# Patient Record
Sex: Female | Born: 1940 | Race: Black or African American | Hispanic: No
Health system: Southern US, Community
[De-identification: ages and names within clinical notes are randomized; demographics above are authoritative.]

## PROBLEM LIST (undated history)

## (undated) DIAGNOSIS — E119 Type 2 diabetes mellitus without complications: Secondary | ICD-10-CM

## (undated) DIAGNOSIS — I441 Atrioventricular block, second degree: Secondary | ICD-10-CM

## (undated) DIAGNOSIS — D509 Iron deficiency anemia, unspecified: Secondary | ICD-10-CM

## (undated) DIAGNOSIS — I509 Heart failure, unspecified: Secondary | ICD-10-CM

## (undated) DIAGNOSIS — L89899 Pressure ulcer of other site, unspecified stage: Secondary | ICD-10-CM

## (undated) DIAGNOSIS — I2510 Atherosclerotic heart disease of native coronary artery without angina pectoris: Secondary | ICD-10-CM

## (undated) DIAGNOSIS — I739 Peripheral vascular disease, unspecified: Secondary | ICD-10-CM

## (undated) HISTORY — PX: OTHER SURGICAL HISTORY: SHX169

---

## 2019-06-15 DIAGNOSIS — U071 COVID-19: Secondary | ICD-10-CM

## 2019-06-15 HISTORY — DX: COVID-19: U07.1

## 2019-07-12 ENCOUNTER — Other Ambulatory Visit: Payer: Self-pay

## 2019-07-12 ENCOUNTER — Inpatient Hospital Stay (HOSPITAL_COMMUNITY)
Admission: EM | Admit: 2019-07-12 | Discharge: 2019-08-23 | DRG: 296 | Disposition: E | Payer: Medicare HMO | Source: Skilled Nursing Facility | Attending: Emergency Medicine | Admitting: Emergency Medicine

## 2019-07-12 ENCOUNTER — Inpatient Hospital Stay (HOSPITAL_COMMUNITY): Payer: Medicare HMO

## 2019-07-12 ENCOUNTER — Encounter (HOSPITAL_COMMUNITY): Payer: Self-pay

## 2019-07-12 ENCOUNTER — Emergency Department (HOSPITAL_COMMUNITY): Payer: Medicare HMO

## 2019-07-12 DIAGNOSIS — E43 Unspecified severe protein-calorie malnutrition: Secondary | ICD-10-CM | POA: Diagnosis present

## 2019-07-12 DIAGNOSIS — Z8616 Personal history of COVID-19: Secondary | ICD-10-CM | POA: Diagnosis not present

## 2019-07-12 DIAGNOSIS — G9341 Metabolic encephalopathy: Secondary | ICD-10-CM | POA: Diagnosis present

## 2019-07-12 DIAGNOSIS — I441 Atrioventricular block, second degree: Secondary | ICD-10-CM | POA: Diagnosis present

## 2019-07-12 DIAGNOSIS — K922 Gastrointestinal hemorrhage, unspecified: Secondary | ICD-10-CM | POA: Diagnosis present

## 2019-07-12 DIAGNOSIS — E11622 Type 2 diabetes mellitus with other skin ulcer: Secondary | ICD-10-CM | POA: Diagnosis present

## 2019-07-12 DIAGNOSIS — N179 Acute kidney failure, unspecified: Secondary | ICD-10-CM | POA: Diagnosis present

## 2019-07-12 DIAGNOSIS — E1169 Type 2 diabetes mellitus with other specified complication: Secondary | ICD-10-CM | POA: Diagnosis present

## 2019-07-12 DIAGNOSIS — E1152 Type 2 diabetes mellitus with diabetic peripheral angiopathy with gangrene: Secondary | ICD-10-CM | POA: Diagnosis present

## 2019-07-12 DIAGNOSIS — J9620 Acute and chronic respiratory failure, unspecified whether with hypoxia or hypercapnia: Secondary | ICD-10-CM | POA: Diagnosis not present

## 2019-07-12 DIAGNOSIS — Z515 Encounter for palliative care: Secondary | ICD-10-CM | POA: Diagnosis not present

## 2019-07-12 DIAGNOSIS — J9602 Acute respiratory failure with hypercapnia: Secondary | ICD-10-CM | POA: Diagnosis not present

## 2019-07-12 DIAGNOSIS — A419 Sepsis, unspecified organism: Secondary | ICD-10-CM | POA: Diagnosis present

## 2019-07-12 DIAGNOSIS — I6523 Occlusion and stenosis of bilateral carotid arteries: Secondary | ICD-10-CM | POA: Diagnosis present

## 2019-07-12 DIAGNOSIS — I110 Hypertensive heart disease with heart failure: Secondary | ICD-10-CM | POA: Diagnosis present

## 2019-07-12 DIAGNOSIS — R001 Bradycardia, unspecified: Secondary | ICD-10-CM | POA: Diagnosis present

## 2019-07-12 DIAGNOSIS — I5032 Chronic diastolic (congestive) heart failure: Secondary | ICD-10-CM | POA: Diagnosis present

## 2019-07-12 DIAGNOSIS — Y839 Surgical procedure, unspecified as the cause of abnormal reaction of the patient, or of later complication, without mention of misadventure at the time of the procedure: Secondary | ICD-10-CM | POA: Diagnosis present

## 2019-07-12 DIAGNOSIS — U071 COVID-19: Secondary | ICD-10-CM | POA: Diagnosis present

## 2019-07-12 DIAGNOSIS — R6521 Severe sepsis with septic shock: Secondary | ICD-10-CM | POA: Diagnosis present

## 2019-07-12 DIAGNOSIS — J9690 Respiratory failure, unspecified, unspecified whether with hypoxia or hypercapnia: Secondary | ICD-10-CM

## 2019-07-12 DIAGNOSIS — I739 Peripheral vascular disease, unspecified: Secondary | ICD-10-CM | POA: Diagnosis present

## 2019-07-12 DIAGNOSIS — Z6823 Body mass index (BMI) 23.0-23.9, adult: Secondary | ICD-10-CM

## 2019-07-12 DIAGNOSIS — Z66 Do not resuscitate: Secondary | ICD-10-CM | POA: Diagnosis not present

## 2019-07-12 DIAGNOSIS — E875 Hyperkalemia: Secondary | ICD-10-CM | POA: Diagnosis present

## 2019-07-12 DIAGNOSIS — L89152 Pressure ulcer of sacral region, stage 2: Secondary | ICD-10-CM | POA: Diagnosis present

## 2019-07-12 DIAGNOSIS — G931 Anoxic brain damage, not elsewhere classified: Secondary | ICD-10-CM | POA: Diagnosis present

## 2019-07-12 DIAGNOSIS — Z9911 Dependence on respirator [ventilator] status: Secondary | ICD-10-CM | POA: Diagnosis not present

## 2019-07-12 DIAGNOSIS — Z9289 Personal history of other medical treatment: Secondary | ICD-10-CM

## 2019-07-12 DIAGNOSIS — E870 Hyperosmolality and hypernatremia: Secondary | ICD-10-CM | POA: Diagnosis not present

## 2019-07-12 DIAGNOSIS — M86172 Other acute osteomyelitis, left ankle and foot: Secondary | ICD-10-CM | POA: Diagnosis present

## 2019-07-12 DIAGNOSIS — I469 Cardiac arrest, cause unspecified: Secondary | ICD-10-CM | POA: Diagnosis present

## 2019-07-12 DIAGNOSIS — R404 Transient alteration of awareness: Secondary | ICD-10-CM | POA: Diagnosis present

## 2019-07-12 DIAGNOSIS — D509 Iron deficiency anemia, unspecified: Secondary | ICD-10-CM | POA: Diagnosis present

## 2019-07-12 DIAGNOSIS — E119 Type 2 diabetes mellitus without complications: Secondary | ICD-10-CM

## 2019-07-12 DIAGNOSIS — L89312 Pressure ulcer of right buttock, stage 2: Secondary | ICD-10-CM | POA: Diagnosis present

## 2019-07-12 DIAGNOSIS — J9621 Acute and chronic respiratory failure with hypoxia: Secondary | ICD-10-CM | POA: Diagnosis present

## 2019-07-12 DIAGNOSIS — L89322 Pressure ulcer of left buttock, stage 2: Secondary | ICD-10-CM | POA: Diagnosis present

## 2019-07-12 DIAGNOSIS — E11649 Type 2 diabetes mellitus with hypoglycemia without coma: Secondary | ICD-10-CM | POA: Diagnosis not present

## 2019-07-12 DIAGNOSIS — S98921A Partial traumatic amputation of right foot, level unspecified, initial encounter: Secondary | ICD-10-CM | POA: Diagnosis present

## 2019-07-12 DIAGNOSIS — J9600 Acute respiratory failure, unspecified whether with hypoxia or hypercapnia: Secondary | ICD-10-CM | POA: Diagnosis not present

## 2019-07-12 DIAGNOSIS — I2510 Atherosclerotic heart disease of native coronary artery without angina pectoris: Secondary | ICD-10-CM | POA: Diagnosis present

## 2019-07-12 DIAGNOSIS — T8189XA Other complications of procedures, not elsewhere classified, initial encounter: Secondary | ICD-10-CM | POA: Diagnosis present

## 2019-07-12 DIAGNOSIS — R570 Cardiogenic shock: Secondary | ICD-10-CM | POA: Diagnosis not present

## 2019-07-12 DIAGNOSIS — E162 Hypoglycemia, unspecified: Secondary | ICD-10-CM | POA: Diagnosis present

## 2019-07-12 DIAGNOSIS — R402432 Glasgow coma scale score 3-8, at arrival to emergency department: Secondary | ICD-10-CM

## 2019-07-12 DIAGNOSIS — L8990 Pressure ulcer of unspecified site, unspecified stage: Secondary | ICD-10-CM | POA: Diagnosis present

## 2019-07-12 DIAGNOSIS — S91302A Unspecified open wound, left foot, initial encounter: Secondary | ICD-10-CM | POA: Diagnosis present

## 2019-07-12 DIAGNOSIS — F0150 Vascular dementia without behavioral disturbance: Secondary | ICD-10-CM | POA: Diagnosis present

## 2019-07-12 DIAGNOSIS — T8781 Dehiscence of amputation stump: Secondary | ICD-10-CM | POA: Diagnosis present

## 2019-07-12 DIAGNOSIS — J9601 Acute respiratory failure with hypoxia: Secondary | ICD-10-CM | POA: Diagnosis not present

## 2019-07-12 DIAGNOSIS — R54 Age-related physical debility: Secondary | ICD-10-CM | POA: Diagnosis present

## 2019-07-12 HISTORY — DX: Pressure ulcer of other site, unspecified stage: L89.899

## 2019-07-12 HISTORY — DX: Atrioventricular block, second degree: I44.1

## 2019-07-12 HISTORY — DX: Heart failure, unspecified: I50.9

## 2019-07-12 HISTORY — DX: Iron deficiency anemia, unspecified: D50.9

## 2019-07-12 HISTORY — DX: Peripheral vascular disease, unspecified: I73.9

## 2019-07-12 HISTORY — DX: Atherosclerotic heart disease of native coronary artery without angina pectoris: I25.10

## 2019-07-12 HISTORY — DX: Type 2 diabetes mellitus without complications: E11.9

## 2019-07-12 LAB — URINALYSIS, ROUTINE W REFLEX MICROSCOPIC
Bilirubin Urine: NEGATIVE
Glucose, UA: 50 mg/dL — AB
Hgb urine dipstick: NEGATIVE
Ketones, ur: NEGATIVE mg/dL
Nitrite: NEGATIVE
Protein, ur: 30 mg/dL — AB
RBC / HPF: >50 RBC/hpf — ABNORMAL HIGH (ref 0–5)
Specific Gravity, Urine: 1.015 (ref 1.005–1.030)
WBC, UA: >50 WBC/hpf — ABNORMAL HIGH (ref 0–5)
pH: 5 (ref 5.0–8.0)

## 2019-07-12 LAB — GLUCOSE, CAPILLARY
Glucose-Capillary: 28 mg/dL — CL (ref 70–99)
Glucose-Capillary: 281 mg/dL — ABNORMAL HIGH (ref 70–99)
Glucose-Capillary: 111 mg/dL — ABNORMAL HIGH (ref 70–99)

## 2019-07-12 LAB — COMPREHENSIVE METABOLIC PANEL
ALT: 8 U/L (ref 0–44)
AST: 25 U/L (ref 15–41)
Albumin: 1.8 g/dL — ABNORMAL LOW (ref 3.5–5.0)
Alkaline Phosphatase: 181 U/L — ABNORMAL HIGH (ref 38–126)
Anion gap: 9 (ref 5–15)
BUN: 44 mg/dL — ABNORMAL HIGH (ref 8–23)
CO2: 21 mmol/L — ABNORMAL LOW (ref 22–32)
Calcium: 8.6 mg/dL — ABNORMAL LOW (ref 8.9–10.3)
Chloride: 105 mmol/L (ref 98–111)
Creatinine, Ser: 1.82 mg/dL — ABNORMAL HIGH (ref 0.44–1.00)
GFR calc Af Amer: 30 mL/min — ABNORMAL LOW (ref >60)
GFR calc non Af Amer: 26 mL/min — ABNORMAL LOW (ref >60)
Glucose, Bld: 209 mg/dL — ABNORMAL HIGH (ref 70–99)
Potassium: 7.3 mmol/L (ref 3.5–5.1)
Sodium: 135 mmol/L (ref 135–145)
Total Bilirubin: 1.2 mg/dL (ref 0.3–1.2)
Total Protein: 7.5 g/dL (ref 6.5–8.1)

## 2019-07-12 LAB — TROPONIN I (HIGH SENSITIVITY)
Troponin I (High Sensitivity): 39 ng/L — ABNORMAL HIGH (ref <18)
Troponin I (High Sensitivity): 42 ng/L — ABNORMAL HIGH (ref <18)

## 2019-07-12 LAB — POCT I-STAT 7, (LYTES, BLD GAS, ICA,H+H)
Acid-base deficit: 2 mmol/L (ref 0.0–2.0)
Acid-base deficit: 6 mmol/L — ABNORMAL HIGH (ref 0.0–2.0)
Bicarbonate: 24.1 mmol/L (ref 20.0–28.0)
Bicarbonate: 18.2 mmol/L — ABNORMAL LOW (ref 20.0–28.0)
Calcium, Ion: 1.27 mmol/L (ref 1.15–1.40)
Calcium, Ion: 1.27 mmol/L (ref 1.15–1.40)
HCT: 20 % — ABNORMAL LOW (ref 36.0–46.0)
HCT: 21 % — ABNORMAL LOW (ref 36.0–46.0)
Hemoglobin: 6.8 g/dL — CL (ref 12.0–15.0)
Hemoglobin: 7.1 g/dL — ABNORMAL LOW (ref 12.0–15.0)
O2 Saturation: 100 %
O2 Saturation: 99 %
Patient temperature: 35.1
Potassium: 4.9 mmol/L (ref 3.5–5.1)
Potassium: 5.5 mmol/L — ABNORMAL HIGH (ref 3.5–5.1)
Sodium: 138 mmol/L (ref 135–145)
Sodium: 138 mmol/L (ref 135–145)
TCO2: 26 mmol/L (ref 22–32)
TCO2: 19 mmol/L — ABNORMAL LOW (ref 22–32)
pCO2 arterial: 51.1 mmHg — ABNORMAL HIGH (ref 32.0–48.0)
pCO2 arterial: 26.4 mmHg — ABNORMAL LOW (ref 32.0–48.0)
pH, Arterial: 7.282 — ABNORMAL LOW (ref 7.350–7.450)
pH, Arterial: 7.439 (ref 7.350–7.450)
pO2, Arterial: 523 mmHg — ABNORMAL HIGH (ref 83.0–108.0)
pO2, Arterial: 124 mmHg — ABNORMAL HIGH (ref 83.0–108.0)

## 2019-07-12 LAB — POCT I-STAT EG7
Acid-base deficit: 3 mmol/L — ABNORMAL HIGH (ref 0.0–2.0)
Bicarbonate: 22.7 mmol/L (ref 20.0–28.0)
Calcium, Ion: 1.11 mmol/L — ABNORMAL LOW (ref 1.15–1.40)
HCT: 27 % — ABNORMAL LOW (ref 36.0–46.0)
Hemoglobin: 9.2 g/dL — ABNORMAL LOW (ref 12.0–15.0)
O2 Saturation: 76 %
Potassium: 7.5 mmol/L (ref 3.5–5.1)
Sodium: 135 mmol/L (ref 135–145)
TCO2: 24 mmol/L (ref 22–32)
pCO2, Ven: 44.4 mmHg (ref 44.0–60.0)
pH, Ven: 7.317 (ref 7.250–7.430)
pO2, Ven: 44 mmHg (ref 32.0–45.0)

## 2019-07-12 LAB — CBC
HCT: 26.9 % — ABNORMAL LOW (ref 36.0–46.0)
HCT: 25.2 % — ABNORMAL LOW (ref 36.0–46.0)
HCT: 27.4 % — ABNORMAL LOW (ref 36.0–46.0)
Hemoglobin: 7.8 g/dL — ABNORMAL LOW (ref 12.0–15.0)
Hemoglobin: 7.5 g/dL — ABNORMAL LOW (ref 12.0–15.0)
Hemoglobin: 8 g/dL — ABNORMAL LOW (ref 12.0–15.0)
MCH: 22.7 pg — ABNORMAL LOW (ref 26.0–34.0)
MCH: 23.1 pg — ABNORMAL LOW (ref 26.0–34.0)
MCH: 22.9 pg — ABNORMAL LOW (ref 26.0–34.0)
MCHC: 29 g/dL — ABNORMAL LOW (ref 30.0–36.0)
MCHC: 29.8 g/dL — ABNORMAL LOW (ref 30.0–36.0)
MCHC: 29.2 g/dL — ABNORMAL LOW (ref 30.0–36.0)
MCV: 78.4 fL — ABNORMAL LOW (ref 80.0–100.0)
MCV: 77.5 fL — ABNORMAL LOW (ref 80.0–100.0)
MCV: 78.5 fL — ABNORMAL LOW (ref 80.0–100.0)
Platelets: 608 10*3/uL — ABNORMAL HIGH (ref 150–400)
Platelets: 747 10*3/uL — ABNORMAL HIGH (ref 150–400)
Platelets: 589 10*3/uL — ABNORMAL HIGH (ref 150–400)
RBC: 3.43 MIL/uL — ABNORMAL LOW (ref 3.87–5.11)
RBC: 3.25 MIL/uL — ABNORMAL LOW (ref 3.87–5.11)
RBC: 3.49 MIL/uL — ABNORMAL LOW (ref 3.87–5.11)
RDW: 19.9 % — ABNORMAL HIGH (ref 11.5–15.5)
RDW: 20.7 % — ABNORMAL HIGH (ref 11.5–15.5)
RDW: 19 % — ABNORMAL HIGH (ref 11.5–15.5)
WBC: 13.6 10*3/uL — ABNORMAL HIGH (ref 4.0–10.5)
WBC: 22.7 10*3/uL — ABNORMAL HIGH (ref 4.0–10.5)
WBC: 22.1 10*3/uL — ABNORMAL HIGH (ref 4.0–10.5)
nRBC: 0.1 % (ref 0.0–0.2)
nRBC: 0.1 % (ref 0.0–0.2)
nRBC: 0 % (ref 0.0–0.2)

## 2019-07-12 LAB — LACTIC ACID, PLASMA: Lactic Acid, Venous: 1.6 mmol/L (ref 0.5–1.9)

## 2019-07-12 LAB — CBG MONITORING, ED
Glucose-Capillary: 109 mg/dL — ABNORMAL HIGH (ref 70–99)
Glucose-Capillary: 61 mg/dL — ABNORMAL LOW (ref 70–99)
Glucose-Capillary: 87 mg/dL (ref 70–99)
Glucose-Capillary: 74 mg/dL (ref 70–99)

## 2019-07-12 LAB — MAGNESIUM: Magnesium: 2.3 mg/dL (ref 1.7–2.4)

## 2019-07-12 LAB — BASIC METABOLIC PANEL
Anion gap: 15 (ref 5–15)
BUN: 41 mg/dL — ABNORMAL HIGH (ref 8–23)
CO2: 15 mmol/L — ABNORMAL LOW (ref 22–32)
Calcium: 9.1 mg/dL (ref 8.9–10.3)
Chloride: 109 mmol/L (ref 98–111)
Creatinine, Ser: 1.37 mg/dL — ABNORMAL HIGH (ref 0.44–1.00)
GFR calc Af Amer: 42 mL/min — ABNORMAL LOW (ref >60)
GFR calc non Af Amer: 36 mL/min — ABNORMAL LOW (ref >60)
Glucose, Bld: 31 mg/dL — CL (ref 70–99)
Potassium: 6.6 mmol/L (ref 3.5–5.1)
Sodium: 139 mmol/L (ref 135–145)

## 2019-07-12 LAB — PHOSPHORUS: Phosphorus: 3.8 mg/dL (ref 2.5–4.6)

## 2019-07-12 LAB — ABO/RH: ABO/RH(D): O POS

## 2019-07-12 LAB — RESPIRATORY PANEL BY RT PCR (FLU A&B, COVID)
Influenza A by PCR: NEGATIVE
Influenza B by PCR: NEGATIVE
SARS Coronavirus 2 by RT PCR: POSITIVE — AB

## 2019-07-12 LAB — PROTIME-INR
INR: 1 (ref 0.8–1.2)
Prothrombin Time: 13.2 seconds (ref 11.4–15.2)

## 2019-07-12 LAB — HEMOGLOBIN A1C
Hgb A1c MFr Bld: 5.3 % (ref 4.8–5.6)
Mean Plasma Glucose: 105.41 mg/dL

## 2019-07-12 LAB — APTT: aPTT: 53 seconds — ABNORMAL HIGH (ref 24–36)

## 2019-07-12 MED ORDER — SODIUM CHLORIDE 0.9 % IV BOLUS
1000.0000 mL | Freq: Once | INTRAVENOUS | Status: AC
  Administered 2019-07-12: 10:00:00 1000 mL via INTRAVENOUS

## 2019-07-12 MED ORDER — MEPERIDINE HCL 25 MG/ML IJ SOLN: 12.5000 mg | INTRAMUSCULAR | Status: DC | PRN

## 2019-07-12 MED ORDER — HEPARIN SODIUM (PORCINE) 5000 UNIT/ML IJ SOLN
5000.0000 [IU] | Freq: Three times a day (TID) | INTRAMUSCULAR | Status: DC
  Administered 2019-07-12 – 2019-07-23 (×34): 5000 [IU] via SUBCUTANEOUS
  Filled 2019-07-12 (×33): qty 1

## 2019-07-12 MED ORDER — DEXTROSE 50 % IV SOLN
INTRAVENOUS | Status: AC
  Administered 2019-07-12: 25 g via INTRAVENOUS
  Filled 2019-07-12: qty 50

## 2019-07-12 MED ORDER — SODIUM CHLORIDE 0.9 % IV SOLN: INTRAVENOUS | Status: DC

## 2019-07-12 MED ORDER — DEXTROSE 50 % IV SOLN
1.0000 | Freq: Once | INTRAVENOUS | Status: AC
  Administered 2019-07-12: 50 mL via INTRAVENOUS
  Filled 2019-07-12: qty 50

## 2019-07-12 MED ORDER — ASPIRIN 81 MG PO CHEW
81.0000 mg | CHEWABLE_TABLET | Freq: Every day | ORAL | Status: DC
  Administered 2019-07-13 – 2019-07-23 (×11): 81 mg
  Filled 2019-07-12 (×11): qty 1

## 2019-07-12 MED ORDER — MIDAZOLAM HCL 2 MG/2ML IJ SOLN
1.0000 mg | INTRAMUSCULAR | Status: DC | PRN
  Administered 2019-07-12 (×2): 1 mg via INTRAVENOUS
  Filled 2019-07-12: qty 2

## 2019-07-12 MED ORDER — MIDAZOLAM HCL 2 MG/2ML IJ SOLN
INTRAMUSCULAR | Status: AC
  Filled 2019-07-12: qty 2

## 2019-07-12 MED ORDER — DEXTROSE 50 % IV SOLN: 25.0000 g | INTRAVENOUS | Status: AC

## 2019-07-12 MED ORDER — ATROPINE SULFATE 1 MG/10ML IJ SOSY
PREFILLED_SYRINGE | INTRAMUSCULAR | Status: AC
  Filled 2019-07-12: qty 10

## 2019-07-12 MED ORDER — SODIUM CHLORIDE 0.9 % IV SOLN: 1.0000 g | Freq: Once | INTRAVENOUS | Status: DC

## 2019-07-12 MED ORDER — INSULIN ASPART 100 UNIT/ML ~~LOC~~ SOLN
0.0000 [IU] | SUBCUTANEOUS | Status: DC
  Administered 2019-07-13 – 2019-07-14 (×2): 1 [IU] via SUBCUTANEOUS
  Administered 2019-07-14: 2 [IU] via SUBCUTANEOUS
  Administered 2019-07-14: 1 [IU] via SUBCUTANEOUS
  Administered 2019-07-14 – 2019-07-15 (×4): 2 [IU] via SUBCUTANEOUS
  Administered 2019-07-15: 05:00:00 1 [IU] via SUBCUTANEOUS
  Administered 2019-07-15 (×2): 2 [IU] via SUBCUTANEOUS
  Administered 2019-07-15 (×2): 1 [IU] via SUBCUTANEOUS
  Administered 2019-07-16 (×2): 2 [IU] via SUBCUTANEOUS
  Administered 2019-07-16: 3 [IU] via SUBCUTANEOUS
  Administered 2019-07-16: 20:00:00 2 [IU] via SUBCUTANEOUS
  Administered 2019-07-16: 1 [IU] via SUBCUTANEOUS
  Administered 2019-07-16 (×2): 2 [IU] via SUBCUTANEOUS
  Administered 2019-07-17: 1 [IU] via SUBCUTANEOUS
  Administered 2019-07-17: 2 [IU] via SUBCUTANEOUS
  Administered 2019-07-17: 20:00:00 3 [IU] via SUBCUTANEOUS
  Administered 2019-07-17: 1 [IU] via SUBCUTANEOUS
  Administered 2019-07-17 – 2019-07-18 (×6): 2 [IU] via SUBCUTANEOUS
  Administered 2019-07-18: 3 [IU] via SUBCUTANEOUS
  Administered 2019-07-19 (×5): 2 [IU] via SUBCUTANEOUS
  Administered 2019-07-20: 3 [IU] via SUBCUTANEOUS
  Administered 2019-07-20 (×4): 2 [IU] via SUBCUTANEOUS
  Administered 2019-07-20: 08:00:00 3 [IU] via SUBCUTANEOUS
  Administered 2019-07-21 (×4): 2 [IU] via SUBCUTANEOUS
  Administered 2019-07-21: 12:00:00 3 [IU] via SUBCUTANEOUS
  Administered 2019-07-21 – 2019-07-22 (×5): 2 [IU] via SUBCUTANEOUS
  Administered 2019-07-22: 3 [IU] via SUBCUTANEOUS
  Administered 2019-07-22: 01:00:00 2 [IU] via SUBCUTANEOUS
  Administered 2019-07-22: 21:00:00 3 [IU] via SUBCUTANEOUS
  Administered 2019-07-23: 5 [IU] via SUBCUTANEOUS
  Administered 2019-07-23: 3 [IU] via SUBCUTANEOUS
  Administered 2019-07-23 (×2): 2 [IU] via SUBCUTANEOUS

## 2019-07-12 MED ORDER — ETOMIDATE 2 MG/ML IV SOLN
10.0000 mg | Freq: Once | INTRAVENOUS | Status: AC
  Administered 2019-07-12: 10 mg via INTRAVENOUS

## 2019-07-12 MED ORDER — PANTOPRAZOLE SODIUM 40 MG IV SOLR
40.0000 mg | Freq: Every day | INTRAVENOUS | Status: DC
  Administered 2019-07-12: 40 mg via INTRAVENOUS
  Filled 2019-07-12: qty 40

## 2019-07-12 MED ORDER — INSULIN ASPART 100 UNIT/ML IV SOLN
5.0000 [IU] | Freq: Once | INTRAVENOUS | Status: AC
  Administered 2019-07-12: 5 [IU] via INTRAVENOUS

## 2019-07-12 MED ORDER — CALCIUM GLUCONATE-NACL 1-0.675 GM/50ML-% IV SOLN
1.0000 g | Freq: Once | INTRAVENOUS | Status: AC
  Administered 2019-07-12: 1000 mg via INTRAVENOUS
  Filled 2019-07-12: qty 50

## 2019-07-12 MED ORDER — VANCOMYCIN HCL 1500 MG/300ML IV SOLN
1500.0000 mg | Freq: Once | INTRAVENOUS | Status: AC
  Administered 2019-07-12: 17:00:00 1500 mg via INTRAVENOUS
  Filled 2019-07-12: qty 300

## 2019-07-12 MED ORDER — VITAL HIGH PROTEIN PO LIQD
1000.0000 mL | ORAL | Status: DC
  Filled 2019-07-12: qty 1000

## 2019-07-12 MED ORDER — DEXTROSE 50 % IV SOLN
1.0000 | Freq: Once | INTRAVENOUS | Status: AC
  Administered 2019-07-12: 50 mL via INTRAVENOUS

## 2019-07-12 MED ORDER — VANCOMYCIN VARIABLE DOSE PER UNSTABLE RENAL FUNCTION (PHARMACIST DOSING): Status: DC

## 2019-07-12 MED ORDER — MIDAZOLAM HCL 2 MG/2ML IJ SOLN
1.0000 mg | INTRAMUSCULAR | Status: DC | PRN
  Filled 2019-07-12: qty 2

## 2019-07-12 MED ORDER — FENTANYL CITRATE (PF) 100 MCG/2ML IJ SOLN
25.0000 ug | INTRAMUSCULAR | Status: DC | PRN
  Administered 2019-07-13 – 2019-07-17 (×4): 50 ug via INTRAVENOUS
  Filled 2019-07-12 (×5): qty 2

## 2019-07-12 MED ORDER — NOREPINEPHRINE 4 MG/250ML-% IV SOLN
0.0000 ug/min | INTRAVENOUS | Status: DC
  Administered 2019-07-12 (×2): 2 ug/min via INTRAVENOUS
  Administered 2019-07-13: 4 ug/min via INTRAVENOUS
  Filled 2019-07-12 (×2): qty 250

## 2019-07-12 MED ORDER — SODIUM CHLORIDE 0.9 % IV SOLN
2.0000 g | Freq: Once | INTRAVENOUS | Status: AC
  Administered 2019-07-12: 2 g via INTRAVENOUS
  Filled 2019-07-12: qty 20

## 2019-07-12 MED ORDER — SODIUM ZIRCONIUM CYCLOSILICATE 10 G PO PACK
10.0000 g | PACK | Freq: Two times a day (BID) | ORAL | Status: AC
  Administered 2019-07-12 – 2019-07-14 (×4): 10 g via ORAL
  Filled 2019-07-12 (×4): qty 1

## 2019-07-12 MED ORDER — FENTANYL CITRATE (PF) 100 MCG/2ML IJ SOLN
25.0000 ug | INTRAMUSCULAR | Status: DC | PRN
  Filled 2019-07-12: qty 2

## 2019-07-12 MED ORDER — PRO-STAT SUGAR FREE PO LIQD
30.0000 mL | Freq: Two times a day (BID) | ORAL | Status: DC
  Administered 2019-07-13 – 2019-07-23 (×21): 30 mL
  Filled 2019-07-12 (×23): qty 30

## 2019-07-12 MED ORDER — ATROPINE SULFATE 1 MG/10ML IJ SOSY: 0.5000 mg | PREFILLED_SYRINGE | Freq: Once | INTRAMUSCULAR | Status: DC

## 2019-07-12 MED ORDER — ATROPINE SULFATE 0.4 MG/ML IJ SOLN: 0.5000 mg | Freq: Once | INTRAMUSCULAR | Status: DC | PRN

## 2019-07-12 MED ORDER — ROCURONIUM BROMIDE 50 MG/5ML IV SOLN
80.0000 mg | Freq: Once | INTRAVENOUS | Status: AC
  Administered 2019-07-12: 80 mg via INTRAVENOUS

## 2019-07-12 MED ORDER — HEPARIN SODIUM (PORCINE) 5000 UNIT/ML IJ SOLN
INTRAMUSCULAR | Status: AC
  Filled 2019-07-12: qty 1

## 2019-07-12 MED ORDER — DEXTROSE 50 % IV SOLN
12.5000 g | INTRAVENOUS | Status: AC
  Administered 2019-07-12: 12.5 g via INTRAVENOUS
  Filled 2019-07-12: qty 50

## 2019-07-12 MED ORDER — SODIUM CHLORIDE 0.9 % IV SOLN
2.0000 g | INTRAVENOUS | Status: DC
  Administered 2019-07-12: 2 g via INTRAVENOUS
  Filled 2019-07-12 (×2): qty 2

## 2019-07-12 NOTE — Progress Notes (Signed)
EEG complete - results pending 

## 2019-07-12 NOTE — H&P (Signed)
PULMONARY / CRITICAL CARE MEDICINE   NAME:  Karina Sloan, MRN:  [id number], DOB:  09-02-1940, LOS: 0 ADMISSION DATE:  07/03/2019, CONSULTATION DATE: 07/16/2019 REFERRING MD: Emergency department physician, CHIEF COMPLAINT: Status post bradycardic event  BRIEF HISTORY:    79 year old female with history of Mobitz type II heart block and history of Covid 68 who presents altered mental status hypotension bradycardic event requiring pacing and epinephrine drip. HISTORY OF PRESENT ILLNESS   79 year old female with a plethora of health issues he was transferred from [facility] to a skilled nursing facility in [facility].  She is a resident of [place].  She has a past medical history significant for Mobitz type II heart block, reportedly had COVID-19 06/15/2019.  She also suffers from diabetes with vascular insufficiency of the lower extremities requiring surgical interventions.  Reported to have dementia.  Reported to have hypertension.  She also suffers from anemia, delirium, bilateral carotid stenosis, acute and chronic lower GI bleeding, congestive heart failure, acute osteomyelitis of left foot.  She has AV malformation. She was found to in the skilled nursing facility on med pass by the nurse to be unresponsive.  She noted no radial pulse. EMS was activated and noted no pulse, started CPR on their arrival. She is placed on epi drip transcutaneous pacing airway protection and transported to [facility] emergency department, reportedly she has been intubated and she is now off epinephrine and transcutaneous pacer at this time.  Reported to be a full code at this time.  Pulmonary critical care asked to admit.  SIGNIFICANT PAST MEDICAL HISTORY   Mobitz type II heart block Diabetes Dementia Vascular insufficiency secondary to diabetes History of COVID-19 06/15/2019  SIGNIFICANT EVENTS:  07/21/2019 admitted to [facility]  STUDIES:   CT head 2/18 > EEG 2/18  >  CULTURES:  Blood 2/18 > Urine 2/18 > Sputum 2/18 >  ANTIBIOTICS:  Cefepime 2/18 > Vanco 2/18 >  LINES/TUBES:  07/10/2019 endotracheal tube >  CONSULTANTS:    SUBJECTIVE:    CONSTITUTIONAL: BP (!) 159/70   Resp 18   Ht 5\' 4"  (1.626 m)   No intake/output data recorded.     Vent Mode: PRVC FiO2 (%):  [40 %-100 %] 40 % Set Rate:  [18 bmp] 18 bmp Vt Set:  [430 mL] 430 mL PEEP:  [5 cmH20] 5 cmH20 Plateau Pressure:  [19 cmH20] 19 cmH20  PHYSICAL EXAM: General:  Frail elderly female on vent Neuro:  Obtunded no meaningful response.  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 Several open wounds on both feet and heels as below.    R foot                                           R heel    L foot                                  L heel       RESOLVED PROBLEM LIST   ASSESSMENT AND PLAN    Acute hypoxemic respiratory failure in the setting of encephalopathy and obtundation s/p cardiac arrest.  - Full vent support - ABG reviewed, settings adjusted.  -  Follow CXR, ABG - SBT per protocol  Cardiac arrest: unknown downtime Resuscitated by EMS to sinus bradycardia requiring epinephrine and TC pacing. Both of which have now been weaned to off.  - Admit to the intensive care unit - 36C TTM per protocol - Echo - now off pressors and TC pacing. Now sinus.  - ICU hemodynamic monitoring.  History of second degree AV block: Bradycardic immediately post code. Sinus at time of admission. - telemetry monitoring - Consider cardiology consult pending her clinical improvement.   Acute metabolic encephalopathy: at risk for anoxic injury - PRN sedation only for RASS goal 0 to -1.  - CT head - EEG - Neurochecks  Urosepsis: presumed - panculture - Cefepime and vancomycin per pharmacy consult.   AKI no available baseline  creatinine. 1.8 on admission.  Hyperkalemia 7.3 on admit. Trended down to 4.9. - BMP now and trend.  Diabetes mellitus - sliding scale insulin - CBG monitoring  Multiple wounds: dorsal surface of both feet and bilateral heels. Small sacral decub. All present on admission. Pictured above. None are obviously infected.  - WOC consult.  COVID 19: diagnosed 1/22. Now greater than 21 days post diagnosis. No evidence of active COVID-19 pneumonia.  - Supportive care - No need for airborne precautions.   Malnutrition - place OGT - Start TF   Best Practice / Goals of Care / Disposition.   DVT PROPHYLAXIS: Compression hose until CT of the head has been reviewed SUP: PPI NUTRITION: N.p.o. MOBILITY: Bedrest GOALS OF CARE: FULL FAMILY DISCUSSIONS: Son and daughter updated via phone. Endorse full scope of treatment at this time. They are aware of her poor prognosis due to cardiac arrest in the setting of poor baseline health status.  DISPOSITION currently in the emergency room until COVID testing has been completed  LABS  Glucose Recent Labs  Lab 07/10/2019 0946  GLUCAP 109*    BMET Recent Labs  Lab 07/22/2019 1027 07/08/2019 1121  NA 135 138  K 7.5* 4.9    Liver Enzymes No results for input(s): AST, ALT, ALKPHOS, BILITOT, ALBUMIN in the last 168 hours.  Electrolytes No results for input(s): CALCIUM, MG, PHOS in the last 168 hours.  CBC Recent Labs  Lab 06/27/2019 1015 06/29/2019 1027 07/03/2019 1121  WBC 13.6*  --   --   HGB 7.8* 9.2* 6.8*  HCT 26.9* 27.0* 20.0*  PLT 608*  --   --     ABG Recent Labs  Lab 07/18/2019 1121  PHART 7.282*  PCO2ART 51.1*  PO2ART 523.0*    Coag's No results for input(s): APTT, INR in the last 168 hours.  Sepsis Markers Recent Labs  Lab 07/11/2019 1015  LATICACIDVEN 1.6    Cardiac Enzymes No results for input(s): TROPONINI, PROBNP in the last 168 hours.  PAST MEDICAL HISTORY :   She  has a past medical history of CHF (congestive  heart failure) (HCC), Coronary artery disease, COVID-19 (06/15/2019), Diabetes mellitus without complication (HCC), Iron (Fe) deficiency anemia, Pressure ulcer of both feet, PVD (peripheral vascular disease) (HCC), and Second degree AV block, Mobitz type II.  PAST SURGICAL HISTORY:  She  has a past surgical history that includes great toe amputation (Bilateral).  No Known Allergies  No current facility-administered medications on file prior to encounter.   No current outpatient medications on file prior to encounter.    FAMILY HISTORY:   Her family history is not on file.  SOCIAL HISTORY:  She  reports previous alcohol use. She reports  previous drug use.  REVIEW OF SYSTEMS:    Ferol Luz Minor ACNP Acute Care Nurse Practitioner [place] Please consult Amion 07/16/2019, 11:45 AM

## 2019-07-12 NOTE — ED Provider Notes (Signed)
[facility] Emergency Department Provider Note MRN:  [id number]  Arrival date & time: 07/11/2019     Chief Complaint   Altered Mental Status   History of Present Illness   Karina Sloan is a 79 y.o. year-old female with a history of CHF, CAD, diabetes presenting to the ED with chief complaint of altered mental status.  Patient coming from [place] home, was checked on by a nurse there and found her to be unresponsive and without radial pulse.  EMS found her to be pulseless and quickly began CPR.  I was unable to obtain an accurate HPI, PMH, or ROS due to the patient's unresponsiveness.  Level 5 caveat.  Review of Systems  Positive for unresponsiveness, pulselessness.  Patient's Health History    Past Medical History:  Diagnosis Date  . CHF (congestive heart failure) ([place])   . Coronary artery disease   . COVID-19 06/15/2019  . Diabetes mellitus without complication ([place])   . Iron (Fe) deficiency anemia   . Pressure ulcer of both feet   . PVD (peripheral vascular disease) ([place])   . Second degree AV block, Mobitz type II     Past Surgical History:  Procedure Laterality Date  . great toe amputation Bilateral     No family history on file.  Social History   Socioeconomic History  . Marital status: Divorced    Spouse name: Not on file  . Number of children: Not on file  . Years of education: Not on file  . Highest education level: Not on file  Occupational History  . Not on file  Tobacco Use  . Smoking status: Unknown If Ever Smoked  Substance and Sexual Activity  . Alcohol use: Not Currently  . Drug use: Not Currently  . Sexual activity: Not Currently  Other Topics Concern  . Not on file  Social History Narrative  . Not on file   Social Determinants of Health   Financial Resource Strain:   . Difficulty of Paying Living Expenses: Not on file  Food Insecurity:   . Worried About Charity fundraiser in the Last Year: Not on  file  . Ran Out of Food in the Last Year: Not on file  Transportation Needs:   . Lack of Transportation (Medical): Not on file  . Lack of Transportation (Non-Medical): Not on file  Physical Activity:   . Days of Exercise per Week: Not on file  . Minutes of Exercise per Session: Not on file  Stress:   . Feeling of Stress : Not on file  Social Connections:   . Frequency of Communication with Friends and Family: Not on file  . Frequency of Social Gatherings with Friends and Family: Not on file  . Attends Religious Services: Not on file  . Active Member of Clubs or Organizations: Not on file  . Attends Archivist Meetings: Not on file  . Marital Status: Not on file  Intimate Partner Violence:   . Fear of Current or Ex-Partner: Not on file  . Emotionally Abused: Not on file  . Physically Abused: Not on file  . Sexually Abused: Not on file     Physical Exam   Vitals:   07/14/2019 1015 06/29/2019 1030  BP: (!) 146/58 (!) 159/70  Resp: 17 18    CONSTITUTIONAL: Ill-appearing NEURO: Unresponsive, no gag reflex, breathing spontaneously EYES: Pupils 3 mm, unresponsive ENT/NECK:  no LAD, no JVD CARDIO: Regular rate, well-perfused, normal S1 and S2 PULM:  CTAB no wheezing or rhonchi GI/GU:  normal bowel sounds, non-distended, non-tender MSK/SPINE:  No gross deformities, no edema; dressings to bilateral feet SKIN:  no rash, atraumatic PSYCH:  Appropriate speech and behavior  *Additional and/or pertinent findings included in MDM below  Diagnostic and Interventional Summary    EKG Interpretation  Date/Time:  Thursday July 12 2019 09:52:10 EST Ventricular Rate:  71 PR Interval:    QRS Duration: 133 QT Interval:  466 QTC Calculation: 507 R Axis:   55 Text Interpretation: Sinus rhythm with frequent pac Nonspecific intraventricular conduction delay Anteroseptal infarct, old Minimal ST depression, diffuse leads Confirmed by Gerlene Fee [phone]) on 06/27/2019 10:15:35 AM        Cardiac Monitoring Interpretation:  Labs Reviewed  CBC - Abnormal; Notable for the following components:      Result Value   WBC 13.6 (*)    RBC 3.43 (*)    Hemoglobin 7.8 (*)    HCT 26.9 (*)    MCV 78.4 (*)    MCH 22.7 (*)    MCHC 29.0 (*)    RDW 19.9 (*)    Platelets 608 (*)    All other components within normal limits  URINALYSIS, ROUTINE W REFLEX MICROSCOPIC - Abnormal; Notable for the following components:   Color, Urine AMBER (*)    APPearance CLOUDY (*)    Glucose, UA 50 (*)    Protein, ur 30 (*)    Leukocytes,Ua LARGE (*)    RBC / HPF >50 (*)    WBC, UA >50 (*)    Bacteria, UA MANY (*)    All other components within normal limits  CBG MONITORING, ED - Abnormal; Notable for the following components:   Glucose-Capillary 109 (*)    All other components within normal limits  POCT I-STAT EG7 - Abnormal; Notable for the following components:   Acid-base deficit 3.0 (*)    Potassium 7.5 (*)    Calcium, Ion 1.11 (*)    HCT 27.0 (*)    Hemoglobin 9.2 (*)    All other components within normal limits  POCT I-STAT 7, (LYTES, BLD GAS, ICA,H+H) - Abnormal; Notable for the following components:   pH, Arterial 7.282 (*)    pCO2 arterial 51.1 (*)    pO2, Arterial 523.0 (*)    HCT 20.0 (*)    Hemoglobin 6.8 (*)    All other components within normal limits  CULTURE, BLOOD (ROUTINE X 2)  CULTURE, BLOOD (ROUTINE X 2)  RESPIRATORY PANEL BY RT PCR (FLU A&B, COVID)  LACTIC ACID, PLASMA  COMPREHENSIVE METABOLIC PANEL  TYPE AND SCREEN  TROPONIN I (HIGH SENSITIVITY)  TROPONIN I (HIGH SENSITIVITY)    DG Chest Port 1 View    (Results Pending)  CT HEAD WO CONTRAST    (Results Pending)    Medications  norepinephrine (LEVOPHED) 74m in 2563mpremix infusion (0 mcg/min Intravenous Stopped 07/04/2019 1048)  midazolam (VERSED) injection 1 mg (has no administration in time range)  midazolam (VERSED) injection 1 mg (has no administration in time range)  fentaNYL (SUBLIMAZE) injection  25 mcg (has no administration in time range)  fentaNYL (SUBLIMAZE) injection 25-100 mcg (has no administration in time range)  calcium gluconate 1 g/ 50 mL sodium chloride IVPB (1,000 mg Intravenous New Bag/Given 07/17/2019 1057)  calcium gluconate 1 g/ 50 mL sodium chloride IVPB (1,000 mg Intravenous New Bag/Given 07/19/2019 1121)  atropine 1 MG/10ML injection (has no administration in time range)  atropine 1 MG/10ML injection 0.5 mg (has no  administration in time range)  rocuronium (ZEMURON) injection 80 mg (80 mg Intravenous Given 07/16/2019 0945)  etomidate (AMIDATE) injection 10 mg (10 mg Intravenous Given 07/03/2019 0945)  dextrose 50 % solution 50 mL (50 mLs Intravenous Given 07/04/2019 0940)  sodium chloride 0.9 % bolus 1,000 mL (1,000 mLs Intravenous New Bag/Given 06/28/2019 0945)  cefTRIAXone (ROCEPHIN) 2 g in sodium chloride 0.9 % 100 mL IVPB (0 g Intravenous Stopped 07/17/2019 1126)  insulin aspart (novoLOG) injection 5 Units (5 Units Intravenous Given 07/05/2019 1058)  dextrose 50 % solution 50 mL (50 mLs Intravenous Given 07/08/2019 1058)     Procedures  /  Critical Care .Critical Care Performed by: Maudie Flakes, MD Authorized by: Maudie Flakes, MD   Critical care provider statement:    Critical care time (minutes):  35   Critical care was necessary to treat or prevent imminent or life-threatening deterioration of the following conditions:  Circulatory failure (cardiac arrest)   Critical care was time spent personally by me on the following activities:  Discussions with consultants, evaluation of patient's response to treatment, examination of patient, ordering and performing treatments and interventions, ordering and review of laboratory studies, ordering and review of radiographic studies, pulse oximetry, re-evaluation of patient's condition, obtaining history from patient or surrogate and review of old charts External pacer  Date/Time: 07/04/2019 10:16 AM Performed by: Maudie Flakes,  MD Authorized by: Maudie Flakes, MD  Consent: The procedure was performed in an emergent situation. Local anesthesia used: no  Anesthesia: Local anesthesia used: no  Sedation: Patient sedated: no  Patient tolerance: patient tolerated the procedure well with no immediate complications Comments: Cutaneous pacing was continued for about 5 minutes upon patient arrival and then discontinued.  We were prepared to transition her to our pacer machine.  Patient exhibited a spontaneous rhythm in the 50s and did not require further pacing  Procedure Name: Intubation Date/Time: 07/18/2019 10:17 AM Performed by: Maudie Flakes, MD Pre-anesthesia Checklist: Patient identified, Patient being monitored, Emergency Drugs available, Timeout performed and Suction available Oxygen Delivery Method: Ambu bag Preoxygenation: Pre-oxygenation with 100% oxygen Induction Type: Rapid sequence Ventilation: Mask ventilation without difficulty Laryngoscope Size: Mac and 4 Grade View: Grade II Tube size: 7.5 mm Number of attempts: 2 Airway Equipment and Method: Stylet Placement Confirmation: ETT inserted through vocal cords under direct vision,  CO2 detector and Breath sounds checked- equal and bilateral Secured at: 23 cm Tube secured with: ETT holder Difficulty Due To: Difficult Airway- due to reduced neck mobility and Difficult Airway- due to anterior larynx Comments: Mild difficulty with MAC blade, would consider Miller or video laryngoscopy in the future.  10 mg etomidate and 80 mg rocuronium were used.       ED Course and Medical Decision Making  I have reviewed the triage vital signs, the nursing notes, and pertinent available records from the EMR.  Pertinent labs & imaging results that were available during my care of the patient were reviewed by me and considered in my medical decision making (see below for details).     Unresponsiveness and pulselessness prior to arrival, brief CPR followed by  pacing with strong pulses thereafter.  Started on epinephrine drip prior to arrival, hypoglycemia noted only after starting epinephrine drip, also given D10 drip.  On arrival patient was transitioned off of pacing and maintained her own cardiac rhythm and was perfusing with strong femoral pulses.  GCS significantly reduced, breathing spontaneously but no gag reflex, no response to pain, eyes  open and pupils unresponsive.  Unknown amount of downtime.  EMS confirmed that patient is full code.  Patient was without radial pulses but with blood pressures in the 374M systolic.  100% oxygen saturations using nonrebreather in the 100% oxygen.  Concern for metabolic disarray, sepsis, cardiac arrhythmia, intracranial bleeding.  Intubated for airway protection as described above.  Pretreated with low-dose nor epi drip prior to intubation to avoid possible peer intubation arrest.  Awaiting laboratory evaluation and CT imaging of the head.  Accepted for admission by intensivist service, still awaiting CT head.  Barth Kirks. Sedonia Small, MD [place] [email or web address]  Final Clinical Impressions(s) / ED Diagnoses     ICD-10-CM   1. Glasgow coma scale total score 3-8, at arrival to emergency department [facility])  O70.7867   2. Cardiac arrest [facility])  I46.9     ED Discharge Orders    None       Discharge Instructions Discussed with and Provided to Patient:   Discharge Instructions   None       Maudie Flakes, MD 07/13/2019 1150

## 2019-07-12 NOTE — ED Notes (Signed)
CBG collected. Result "75." RN, Kim notified.

## 2019-07-12 NOTE — Progress Notes (Signed)
eLink Physician-Brief Progress Note Patient Name: Karina Sloan DOB: 1941/03/29 MRN: [id number]   Date of Service  07/05/2019  HPI/Events of Note  Pt is receiving targeted temperature management s/p cardiac arrest, she is shivering.  eICU Interventions  Demerol 12.5 mg iv Q 4 hours PRN shivering.        Thomasene Lot Ellaree Gear 07/04/2019, 10:45 PM

## 2019-07-12 NOTE — ED Notes (Signed)
Pads placed on pt for Code Cool

## 2019-07-12 NOTE — Progress Notes (Signed)
vLTM started  Neurology notified  RN instructed on use of event button 

## 2019-07-12 NOTE — Progress Notes (Deleted)
NA

## 2019-07-12 NOTE — Progress Notes (Signed)
RT obtained ABG on pt with the following results and changes. Pts rate was decreased from 24 to 20 per CCM MD Ogan. RT will continue to monitor.   Results for ISHITA, MCNERNEY (MRN [id number]) as of July 20, 2019 22:35  Ref. Range 20-Jul-2019 22:26  Sample type Unknown ARTERIAL  pH, Arterial Latest Ref Range: 7.350 - 7.450  7.439  pCO2 arterial Latest Ref Range: 32.0 - 48.0 mmHg 26.4 (L)  pO2, Arterial Latest Ref Range: 83.0 - 108.0 mmHg 124.0 (H)  TCO2 Latest Ref Range: 22 - 32 mmol/L 19 (L)  Acid-base deficit Latest Ref Range: 0.0 - 2.0 mmol/L 6.0 (H)  Bicarbonate Latest Ref Range: 20.0 - 28.0 mmol/L 18.2 (L)  O2 Saturation Latest Units: % 99.0  Patient temperature Unknown 35.1 C  Collection site Unknown BRACHIAL ARTERY

## 2019-07-12 NOTE — Progress Notes (Signed)
Pharmacy Antibiotic Note  Karina Sloan is a 79 y.o. female admitted on 06/28/2019 with altered mental status s/p CPR and presumed urosepsis. COVID + on 06/15/19. Pharmacy has been consulted for vancomycin and cefepime dosing.  Plan: Vancomycin 1,250 mg IV x 1 Hold off repeat dosing given AKI (Scr 1.82 - was 1.0 in December) Cefepime 2 g IV q24h Monitor renal function, cultures, vancomycin levels as indicated  Height: 5\' 8"  (172.7 cm) Weight: 163 lb 2.3 oz (74 kg) IBW/kg (Calculated) : 63.9  Temp (24hrs), Avg:93.6 F (34.2 C), Min:93.3 F (34.1 C), Max:94.1 F (34.5 C)  Recent Labs  Lab 07/09/2019 1015  WBC 13.6*  CREATININE 1.82*  LATICACIDVEN 1.6    Estimated Creatinine Clearance: 24.9 mL/min (A) (by C-G formula based on SCr of 1.82 mg/dL (H)).    No Known Allergies  Antimicrobials this admission: Vancomycin 2/18 >>  Cefepime 2/18 >>  Ceftriaxone x 1 2/18  Dose adjustments this admission: None  Microbiology results: 2/18 BCx: sent 2/18 COVID/flu: COVID +, flu neg  Thank you for allowing pharmacy to be a part of this patient's care.  3/18, PharmD, [facility] PGY2 Cardiology Pharmacy Resident Phone [phone] 07/16/2019       4:22 PM  Please check [email or web address] for unit-specific pharmacist phone numbers

## 2019-07-12 NOTE — Progress Notes (Signed)
eLink Physician-Brief Progress Note Patient Name: Karina Sloan DOB: 03/05/1941 MRN: [id number]   Date of Service  06/30/2019  HPI/Events of Note  Pt transferred to the ICU this morning following  cardiac arrest on the regular ward. Pt is comatose s/p arrest. She is intubated and on the ventilator. Family made her a DNR earlier today.  eICU Interventions  Continue supportive rx, TTM, and frequent neurological assessment for evidence of neurologic recovery. Avoid sedatives.  New Patient Evaluation completed.        [name] 07/06/2019, 9:25 PM

## 2019-07-12 NOTE — Progress Notes (Signed)
eLink Physician-Brief Progress Note Patient Name: Karina Sloan DOB: Apr 26, 1941 MRN: [id number]   Date of Service  07/01/2019  HPI/Events of Note  Pt with a drop in blood pressure and heart rate over the course of the evening, she remains comatose.  eICU Interventions  Norepinephrine infusion ordered to start targeting a MAP of > 65 mmHg, I discussed with Pt's daughter and son, I updated them on her status and the starting of pressors, I will follow up with them for any material decline in clinical status.        Thomasene Lot Samrat Hayward 06/25/2019, 11:27 PM

## 2019-07-12 NOTE — Progress Notes (Signed)
PCCM INTERVAL PROGRESS NOTE   Just had another discussion with both the patient's son and daughter. They wish to pursue a DNR order at this time considering her critical illness and baseline poor functional status. Medical record updated.    Joneen Roach, AGACNP-BC [place] [facility]  See Amion for personal pager PCCM on call pager [phone]  07/10/2019 5:22 PM

## 2019-07-12 NOTE — ED Notes (Signed)
EEG being obtained at this time 

## 2019-07-12 NOTE — ED Notes (Signed)
Cold cool started

## 2019-07-12 NOTE — ED Notes (Signed)
Activated code cool with Cassie from [facility]

## 2019-07-12 NOTE — ED Triage Notes (Signed)
Per [facility] pt from [facility], called pt unconscious, RN found her on her med pass, unable to palpate radial pulse, carotid of 40. EMS began pacing and bagging. Epi gtt ,  18 REJ   CBG 71 initially en route 30 gave D10 gtt w/12.5 gms sugar    SBP 70

## 2019-07-13 ENCOUNTER — Inpatient Hospital Stay (HOSPITAL_COMMUNITY): Payer: Medicare HMO

## 2019-07-13 DIAGNOSIS — G9341 Metabolic encephalopathy: Secondary | ICD-10-CM | POA: Diagnosis present

## 2019-07-13 DIAGNOSIS — A419 Sepsis, unspecified organism: Secondary | ICD-10-CM | POA: Diagnosis present

## 2019-07-13 DIAGNOSIS — I469 Cardiac arrest, cause unspecified: Secondary | ICD-10-CM

## 2019-07-13 DIAGNOSIS — R6521 Severe sepsis with septic shock: Secondary | ICD-10-CM | POA: Diagnosis present

## 2019-07-13 DIAGNOSIS — E162 Hypoglycemia, unspecified: Secondary | ICD-10-CM | POA: Diagnosis present

## 2019-07-13 DIAGNOSIS — J9601 Acute respiratory failure with hypoxia: Secondary | ICD-10-CM

## 2019-07-13 DIAGNOSIS — L8990 Pressure ulcer of unspecified site, unspecified stage: Secondary | ICD-10-CM | POA: Diagnosis present

## 2019-07-13 LAB — GLUCOSE, CAPILLARY
Glucose-Capillary: 21 mg/dL — CL (ref 70–99)
Glucose-Capillary: 80 mg/dL (ref 70–99)
Glucose-Capillary: 87 mg/dL (ref 70–99)
Glucose-Capillary: 91 mg/dL (ref 70–99)
Glucose-Capillary: 114 mg/dL — ABNORMAL HIGH (ref 70–99)
Glucose-Capillary: 73 mg/dL (ref 70–99)
Glucose-Capillary: 121 mg/dL — ABNORMAL HIGH (ref 70–99)
Glucose-Capillary: 133 mg/dL — ABNORMAL HIGH (ref 70–99)

## 2019-07-13 LAB — MAGNESIUM
Magnesium: 2 mg/dL (ref 1.7–2.4)
Magnesium: 2 mg/dL (ref 1.7–2.4)

## 2019-07-13 LAB — POCT I-STAT 7, (LYTES, BLD GAS, ICA,H+H)
Acid-base deficit: 6 mmol/L — ABNORMAL HIGH (ref 0.0–2.0)
Bicarbonate: 18.8 mmol/L — ABNORMAL LOW (ref 20.0–28.0)
Calcium, Ion: 1.25 mmol/L (ref 1.15–1.40)
HCT: 20 % — ABNORMAL LOW (ref 36.0–46.0)
Hemoglobin: 6.8 g/dL — CL (ref 12.0–15.0)
O2 Saturation: 98 %
Patient temperature: 35.8
Potassium: 5.5 mmol/L — ABNORMAL HIGH (ref 3.5–5.1)
Sodium: 139 mmol/L (ref 135–145)
TCO2: 20 mmol/L — ABNORMAL LOW (ref 22–32)
pCO2 arterial: 29.5 mmHg — ABNORMAL LOW (ref 32.0–48.0)
pH, Arterial: 7.408 (ref 7.350–7.450)
pO2, Arterial: 106 mmHg (ref 83.0–108.0)

## 2019-07-13 LAB — APTT: aPTT: 30 seconds (ref 24–36)

## 2019-07-13 LAB — CBC
HCT: 25.6 % — ABNORMAL LOW (ref 36.0–46.0)
Hemoglobin: 7.5 g/dL — ABNORMAL LOW (ref 12.0–15.0)
MCH: 22.7 pg — ABNORMAL LOW (ref 26.0–34.0)
MCHC: 29.3 g/dL — ABNORMAL LOW (ref 30.0–36.0)
MCV: 77.6 fL — ABNORMAL LOW (ref 80.0–100.0)
Platelets: 588 10*3/uL — ABNORMAL HIGH (ref 150–400)
RBC: 3.3 MIL/uL — ABNORMAL LOW (ref 3.87–5.11)
RDW: 19 % — ABNORMAL HIGH (ref 11.5–15.5)
WBC: 28.2 10*3/uL — ABNORMAL HIGH (ref 4.0–10.5)
nRBC: 0.1 % (ref 0.0–0.2)

## 2019-07-13 LAB — PROTIME-INR
INR: 1.1 (ref 0.8–1.2)
Prothrombin Time: 13.7 seconds (ref 11.4–15.2)

## 2019-07-13 LAB — BASIC METABOLIC PANEL
Anion gap: 14 (ref 5–15)
BUN: 41 mg/dL — ABNORMAL HIGH (ref 8–23)
CO2: 16 mmol/L — ABNORMAL LOW (ref 22–32)
Calcium: 8.6 mg/dL — ABNORMAL LOW (ref 8.9–10.3)
Chloride: 109 mmol/L (ref 98–111)
Creatinine, Ser: 1.22 mg/dL — ABNORMAL HIGH (ref 0.44–1.00)
GFR calc Af Amer: 48 mL/min — ABNORMAL LOW (ref >60)
GFR calc non Af Amer: 42 mL/min — ABNORMAL LOW (ref >60)
Glucose, Bld: 93 mg/dL (ref 70–99)
Potassium: 6.1 mmol/L — ABNORMAL HIGH (ref 3.5–5.1)
Sodium: 139 mmol/L (ref 135–145)

## 2019-07-13 LAB — PHOSPHORUS
Phosphorus: 3.9 mg/dL (ref 2.5–4.6)
Phosphorus: 3.4 mg/dL (ref 2.5–4.6)

## 2019-07-13 LAB — ECHOCARDIOGRAM COMPLETE
Height: 68 in
Weight: 2345.69 oz

## 2019-07-13 LAB — MRSA PCR SCREENING: MRSA by PCR: NEGATIVE

## 2019-07-13 MED ORDER — PERFLUTREN LIPID MICROSPHERE
INTRAVENOUS | Status: AC
  Administered 2019-07-13: 10 mL via INTRAVENOUS
  Filled 2019-07-13: qty 10

## 2019-07-13 MED ORDER — DEXTROSE 10 % IV SOLN: INTRAVENOUS | Status: DC

## 2019-07-13 MED ORDER — VITAL HIGH PROTEIN PO LIQD
1000.0000 mL | ORAL | Status: DC
  Administered 2019-07-13 – 2019-07-16 (×5): 1000 mL

## 2019-07-13 MED ORDER — PERFLUTREN LIPID MICROSPHERE
1.0000 mL | INTRAVENOUS | Status: DC | PRN
  Administered 2019-07-13: 3 mL via INTRAVENOUS
  Filled 2019-07-13: qty 10

## 2019-07-13 MED ORDER — ADULT MULTIVITAMIN W/MINERALS CH
1.0000 | ORAL_TABLET | Freq: Every day | ORAL | Status: DC
  Administered 2019-07-13 – 2019-07-23 (×11): 1
  Filled 2019-07-13 (×11): qty 1

## 2019-07-13 MED ORDER — SODIUM CHLORIDE 0.9 % IV SOLN
2.0000 g | Freq: Two times a day (BID) | INTRAVENOUS | Status: AC
  Administered 2019-07-13 – 2019-07-16 (×7): 2 g via INTRAVENOUS
  Filled 2019-07-13 (×8): qty 2

## 2019-07-13 MED ORDER — CHLORHEXIDINE GLUCONATE 0.12% ORAL RINSE (MEDLINE KIT)
15.0000 mL | Freq: Two times a day (BID) | OROMUCOSAL | Status: DC
  Administered 2019-07-13 – 2019-07-23 (×21): 15 mL via OROMUCOSAL

## 2019-07-13 MED ORDER — VANCOMYCIN HCL 750 MG/150ML IV SOLN
750.0000 mg | INTRAVENOUS | Status: DC
  Administered 2019-07-13 – 2019-07-15 (×3): 750 mg via INTRAVENOUS
  Filled 2019-07-13 (×3): qty 150

## 2019-07-13 MED ORDER — COLLAGENASE 250 UNIT/GM EX OINT
TOPICAL_OINTMENT | Freq: Every day | CUTANEOUS | Status: DC
  Administered 2019-07-19 – 2019-07-23 (×2): 1 via TOPICAL
  Filled 2019-07-13 (×3): qty 30

## 2019-07-13 MED ORDER — ORAL CARE MOUTH RINSE
15.0000 mL | OROMUCOSAL | Status: DC
  Administered 2019-07-13 – 2019-07-23 (×107): 15 mL via OROMUCOSAL

## 2019-07-13 MED ORDER — CHLORHEXIDINE GLUCONATE CLOTH 2 % EX PADS
6.0000 | MEDICATED_PAD | Freq: Every day | CUTANEOUS | Status: DC
  Administered 2019-07-13 – 2019-07-23 (×10): 6 via TOPICAL

## 2019-07-13 MED ORDER — PANTOPRAZOLE SODIUM 40 MG PO PACK
40.0000 mg | PACK | ORAL | Status: DC
  Administered 2019-07-13 – 2019-07-23 (×11): 40 mg
  Filled 2019-07-13 (×11): qty 20

## 2019-07-13 NOTE — Progress Notes (Signed)
RT obtained ABG on pt with the following results and changes. Pts rate decreased from 20 to 16 per MD order. RT will continue to monitor.    Results for Karina Sloan, Karina Sloan (MRN [id number]) as of 07/13/2019 01:50  Ref. Range 07/13/2019 01:43  Sample type Unknown ARTERIAL  pH, Arterial Latest Ref Range: 7.350 - 7.450  7.408  pCO2 arterial Latest Ref Range: 32.0 - 48.0 mmHg 29.5 (L)  pO2, Arterial Latest Ref Range: 83.0 - 108.0 mmHg 106.0  TCO2 Latest Ref Range: 22 - 32 mmol/L 20 (L)  Acid-base deficit Latest Ref Range: 0.0 - 2.0 mmol/L 6.0 (H)  Bicarbonate Latest Ref Range: 20.0 - 28.0 mmol/L 18.8 (L)  O2 Saturation Latest Units: % 98.0  Patient temperature Unknown 35.8 C  Collection site Unknown BRACHIAL ARTERY

## 2019-07-13 NOTE — Progress Notes (Signed)
Main complete no skin breakdown F4 Fp2 F3 C3 F7 no skin breakdown

## 2019-07-13 NOTE — Progress Notes (Signed)
Initial Nutrition Assessment  DOCUMENTATION CODES:   Not applicable  INTERVENTION:  Initiate Vital High Protein @ 45 ml/hr with 30 ml Prostat BID via tube   MVI with minerals daily via tube  Provides 1280 kcal, 125 grams of protein, and 907 ml free water    NUTRITION DIAGNOSIS:   Inadequate oral intake related to inability to eat as evidenced by NPO status.    GOAL:   Provide needs based on ASPEN/SCCM guidelines    MONITOR:   Labs, I & O's, Vent status, Skin, TF tolerance, Weight trends, Diet advancement  REASON FOR ASSESSMENT:   Ventilator, Consult Enteral/tube feeding initiation and management  ASSESSMENT:  RD working remotely.  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 CPR on EMS arrival, required pacing and Epi and admitted for cardiac arrest.  2/18 Intubated  Per notes: -Unresponsive in ED, 36C TTM started -EEG suggestive of severe diffuse encephalopathy, nonspecific to etiology.  Patient is currently intubated on ventilator support MV: 9.8 L/min Temp (24hrs), Avg:94.2 F (34.6 C), Min:93.3 F (34.1 C), Max:96.8 F (36 C)  Propofol: n/a Medications reviewed and include: SS novolog, Protonix, Lokelma Drips: NaCl Maxipime D10 Levo 7.5 mcg  Labs: CBG 91, K 6.1 (H), BUN 41 (H), Cr 1.22 (H), WBC 28.2 (H), Hgb 7.5 (L) Mg/P - WNL  NUTRITION - FOCUSED PHYSICAL EXAM: Unable to complete at this time, RD working remotely.   Diet Order:   Diet Order    None      EDUCATION NEEDS:   No education needs have been identified at this time  Skin:  Skin Assessment: Skin Integrity Issues: Skin Integrity Issues:: Stage II, Other  (Comment) Stage II: Buttocks Other: Amputation:L big toe, all R toes  Last BM:  2/18 type 1  Height:   Ht Readings from Last 1 Encounters:  07/18/2019 5\' 8"  (1.727 m)    Weight:   Wt Readings from Last 1 Encounters:  07/13/19 66.5 kg     BMI:  Body mass index is 22.29 kg/m.  Estimated Nutritional Needs:   Kcal:  1245  Protein:  100-115  Fluid:  >/= 1.2 L/day   07/15/19, RD, LDN Clinical Nutrition [place] Telephone [phone] After Hours/Weekend Pager # in [facility]

## 2019-07-13 NOTE — Progress Notes (Signed)
DNR bracelet placed on pt and verified by Ian Malkin, RN

## 2019-07-13 NOTE — Progress Notes (Signed)
Patient from [facility] CSW spoke with Chantell [phone] and provided update on patient.  Verlon Au, [place] [phone]

## 2019-07-13 NOTE — Progress Notes (Signed)
Inpatient Diabetes Program Recommendations  AACE/ADA: New Consensus Statement on Inpatient Glycemic Control (2015)  Target Ranges:  Prepandial:   less than 140 mg/dL      Peak postprandial:   less than 180 mg/dL (1-2 hours)      Critically ill patients:  140 - 180 mg/dL   Lab Results  Component Value Date   GLUCAP 91 07/13/2019   HGBA1C 5.3 08/08/2019    Review of Glycemic Control Results for Karina Sloan, Karina Sloan (MRN [id number]) as of 07/13/2019 08:53  Ref. Range 07/13/2019 01:39 07/13/2019 05:20 07/13/2019 07:51  Glucose-Capillary Latest Ref Range: 70 - 99 mg/dL 80 87 91   Diabetes history: Type 2 DM Outpatient Diabetes medications: Novolog 0-14 units TID, Metformin 500 mg QAM Current orders for Inpatient glycemic control: Novolog 0-9 units Q4H D10% @ 50 ml/hr  Inpatient Diabetes Program Recommendations:    Noted significant hypoglycemia following Novolog 5 units x 1. No further insulin administered. Question validity of glucose serum (209 mg/dL) given prior CBG on presentation and Dextrose prior to serum.   Consider decreasing correction to Novolog 0-6 units Q4H.   Thanks, Lujean Rave, MSN, RNC-OB Diabetes Coordinator [phone] (8a-5p)

## 2019-07-13 NOTE — Procedures (Addendum)
Patient Name: Karina Sloan  MRN: [id number]  Epilepsy Attending: Charlsie Quest  Referring Physician/Provider: Joneen Roach, NP  Duration: 06/25/2019 1900 to 07/13/2019 1900  Patient history: 79 year old female status post cardiac arrest on TTM.  EEG evaluate for seizures.  Level of alertness: Comatose  AEDs during EEG study: None  Technical aspects: This EEG study was done with scalp electrodes positioned according to the 10-20 International system of electrode placement. Electrical activity was acquired at a sampling rate of 500Hz  and reviewed with a high frequency filter of 70Hz  and a low frequency filter of 1Hz . EEG data were recorded continuously and digitally stored.   Description: EEG showed continuous generalized low amplitude 3 to 5 Hz theta-delta slowing. EEG was reactive to tactile stimuli. Hyperventilation and photic stimulation were not performed.  Event button was pressed on 07/17/2019 at 2233.  Patient was noted to have brief whole-body stiffening which could have been a cough reflex. Concomitant EEG before during and after the event did not show any EEG change to suggest seizure.  Abnormality -Continuous slow, generalized  IMPRESSION: This study is suggestive of severe diffuse encephalopathy, nonspecific to etiology. No seizures or epileptiform discharges were seen throughout the recording.  Event button was pressed on 07/06/2019 at 2233 as described above without EEG change and was likely not epileptic.  Jackson Coffield [phone]

## 2019-07-13 NOTE — Progress Notes (Signed)
Pharmacy Antibiotic Note  Karina Sloan is a 79 y.o. female admitted on Aug 02, 2019 with altered mental status s/p CPR and presumed urosepsis. COVID + on 06/15/19. Pharmacy has been consulted for vancomycin and cefepime dosing. -SCr= 1.22 (trend down), CrCl ~ 40 -WBC= 28.2   Plan: Change Cefepime to 2gm IV q12h Vanc 750mg  IV q24h (AUC= 448 w/ SCr= 1.22) follow renal function, cultures, vanc levels and clinical progress  Height: 5\' 8"  (172.7 cm) Weight: 146 lb 9.7 oz (66.5 kg) IBW/kg (Calculated) : 63.9  Temp (24hrs), Avg:94.2 F (34.6 C), Min:93.3 F (34.1 C), Max:96.8 F (36 C)  Recent Labs  Lab August 02, 2019 1015 2019/08/02 1855 08/02/19 2212 07/13/19 0517  WBC 13.6* 22.7* 22.1* 28.2*  CREATININE 1.82*  --  1.37* 1.22*  LATICACIDVEN 1.6  --   --   --     Estimated Creatinine Clearance: 37.1 mL/min (A) (by C-G formula based on SCr of 1.22 mg/dL (H)).    No Known Allergies  Antimicrobials this admission: Vancomycin 2/18 >>  Cefepime 2/18 >>  Ceftriaxone x 1 2/18  Dose adjustments this admission:   Microbiology results: Resp 2/19 Bcx 2/18: ngtd  Thank you for allowing pharmacy to be a part of this patient's care.  3/19, PharmD Clinical Pharmacist **Pharmacist phone directory can now be found on [email or web address] ([email or web address]).  Listed under [facility].

## 2019-07-13 NOTE — Progress Notes (Addendum)
PULMONARY / CRITICAL CARE MEDICINE   NAME:  Karina Sloan, MRN:  [id number], DOB:  1940-10-08, LOS: 1 ADMISSION DATE:  06/27/2019, CONSULTATION DATE: 07/09/2019 REFERRING MD: Emergency department physician, CHIEF COMPLAINT: Status post bradycardic event  BRIEF HISTORY:    79 year old female found pulseless at SNF and had short CPR by EMS. Unresponsive in ED and 36C TTM started.   HISTORY OF PRESENT ILLNESS   79 year old female with a plethora of health issues he was transferred from [facility] to a skilled nursing facility in [facility].  She is a resident of [place].  COVID-19 diagnosed 06/15/19.  She also suffers from diabetes with vascular insufficiency of the lower extremities requiring surgical interventions.  Reported to have dementia.  Reported to have hypertension.  She also suffers from anemia, delirium, bilateral carotid stenosis, acute and chronic lower GI bleeding, congestive heart failure, acute osteomyelitis of left foot.  She has AV malformation. She was found to in the skilled nursing facility on med pass by the nurse to be unresponsive.  She noted no radial pulse. EMS was activated and noted no pulse, started CPR on their arrival. She is placed on epi drip transcutaneous pacing airway protection and transported to [facility] emergency department, reportedly she has been intubated and she is now off epinephrine and transcutaneous pacer at this time.  Reported to be a full code at this time.  Pulmonary critical care asked to admit.  SIGNIFICANT PAST MEDICAL HISTORY   Mobitz type II heart block, Diabetes, Dementia, Vascular insufficiency secondary to diabetes History of COVID-19 06/15/2019  SIGNIFICANT EVENTS:  07/04/2019 admitted to [facility]  STUDIES:   CT head 2/18 > nonacute EEG 2/18 >  CULTURES:  COVID 1/22 and 2/18 positive Blood 2/18 > Urine 2/18 > Sputum 2/18 >  ANTIBIOTICS:  Cefepime 2/18 > Vanco 2/18 >  LINES/TUBES:    07/05/2019 endotracheal tube >  CONSULTANTS:    SUBJECTIVE:  Started on norepinephrine overnight for hypotension. Remains on this morning and is currently hypertensive. Has not shown signs of neurologic recovery at this time.    CONSTITUTIONAL: BP 107/69   Pulse 64   Temp (!) 96.8 F (36 C)   Resp 20   Ht 5\' 8"  (1.727 m)   Wt 66.5 kg   SpO2 100%   BMI 22.29 kg/m   I/O last 3 completed shifts: In: 2315.5 [I.V.:1055.5; Other:10; IV Piggyback:1250] Out: 1050 [Urine:1000; Emesis/NG output:50]     Vent Mode: PRVC FiO2 (%):  [30 %-100 %] 30 % Set Rate:  [16 bmp-24 bmp] 16 bmp Vt Set:  [430 mL-510 mL] 510 mL PEEP:  [5 cmH20] 5 cmH20 Plateau Pressure:  [15 cmH20-23 cmH20] 21 cmH20  PHYSICAL EXAM: General:  Frail elderly female on vent Neuro:  Strong gag. Weak corneal. All four extremities with weak flexion to pain. Eyes to pain.  HEENT:  [place]at [place]/AT, PERRL, pupils 3 mm sluggish.  Cardiovascular:  RRR, no MRG Lungs: Clear Abdomen:  Soft, non-distended.  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 Several open wounds on both feet and heels as below.    R foot                                           R heel    L foot  L heel     RESOLVED PROBLEM LIST   ASSESSMENT AND PLAN    Acute hypoxemic respiratory failure in the setting of encephalopathy and obtundation s/p cardiac arrest. Possible PNA. CXR reviewed personally 2/19 shows new L sided infiltrate.  - Full vent support - ABG PRN - Follow CXR - SBT per protocol - ABX as above  Cardiac arrest: Etiology unclear. Hyperkalemia? Primary arrhythmia with known history of 2nd degree block? Unknown downtime resuscitated by EMS to sinus bradycardia requiring epinephrine and TC pacing.  Both of which have now been stopped successfully. - 36C TTM per protocol - Echo pending - ICU hemodynamic monitoring.  Shock: cardiogenic vs septic - levophed for MAP gaol >  - Echo pending - May need CVL. Levo currently at 7 mcg and weaning down.   History of second degree AV block: Bradycardic immediately post code. Sinus at time of admission. No further arrhythmia notes since admission.  - Telemetry monitoring - Consider cardiology consult pending her clinical improvement.   Acute metabolic encephalopathy: at significant risk for anoxic injury - PRN sedation only for RASS goal 0 to -1.  - EEG - Neuro-checks - Supportive care - TTM as above, avoid fevers  Urosepsis: presumed. Cannot rule out HCAP - panculture - Cefepime and vancomycin per pharmacy consult.   AKI no available baseline creatinine. 1.8 on admission.  Hyperkalemia 7.3 on admit. Slowly trending down.  - BMP now and trend. Thompson Caul scheduled  Diabetes mellitus Hypoglycemia - sliding scale insulin - CBG monitoring - D-10 infusion started overnight. - Start TF  Multiple wounds: dorsal surface of both feet and bilateral heels. Small sacral decub. All present on admission. Pictured above. None are obviously infected.  - WOC consult.  COVID 19: diagnosed 1/22. Now greater than 21 days post diagnosis. No evidence of active COVID-19 pneumonia.  - Supportive care - No need for airborne precautions.   Malnutrition - place OGT - Start TF  Best Practice / Goals of Care / Disposition.   DVT PROPHYLAXIS: Compression hose until CT of the head has been reviewed SUP: PPI NUTRITION: N.p.o. MOBILITY: Bedrest GOALS OF CARE: FULL FAMILY DISCUSSIONS: Son updated via phone  DISPOSITION currently in the emergency room until COVID testing has been completed  LABS  Glucose Recent Labs  Lab 08/20/2019 2212 08/06/2019 2222 24-Jul-2019 2249 07/13/19 0139 07/13/19 0520 07/13/19 0751  GLUCAP 28* 281* 111* 80 87 91    BMET Recent Labs  Lab 08/11/2019 1015 08/02/2019 1027 July 24, 2019 2212 07/30/2019 2212 08/02/2019 2226 07/13/19 0143 07/13/19 0517  NA 135   < > 139   < > 138 139 139  K 7.3*    < > 6.6*   < > 5.5* 5.5* 6.1*  CL 105  --  109  --   --   --  109  CO2 21*  --  15*  --   --   --  16*  BUN 44*  --  41*  --   --   --  41*  CREATININE 1.82*  --  1.37*  --   --   --  1.22*  GLUCOSE 209*  --  31*  --   --   --  93   < > = values in this interval not displayed.    Liver Enzymes Recent Labs  Lab 07/25/2019 1015  AST 25  ALT 8  ALKPHOS 181*  BILITOT 1.2  ALBUMIN 1.8*    Electrolytes Recent Labs  Lab Jul 24, 2019 1015  2019/07/26 1700 07/26/19 2212 07/13/19 0517  CALCIUM 8.6*  --  9.1 8.6*  MG  --  2.3  --  2.0  PHOS  --  3.8  --  3.9    CBC Recent Labs  Lab July 26, 2019 1855 July 26, 2019 1855 07/26/19 2212 July 26, 2019 2212 07-26-2019 2226 07/13/19 0143 07/13/19 0517  WBC 22.7*  --  22.1*  --   --   --  28.2*  HGB 7.5*   < > 8.0*   < > 7.1* 6.8* 7.5*  HCT 25.2*   < > 27.4*   < > 21.0* 20.0* 25.6*  PLT 747*  --  589*  --   --   --  588*   < > = values in this interval not displayed.    ABG Recent Labs  Lab 26-Jul-2019 1121 Jul 26, 2019 2226 07/13/19 0143  PHART 7.282* 7.439 7.408  PCO2ART 51.1* 26.4* 29.5*  PO2ART 523.0* 124.0* 106.0    Coag's Recent Labs  Lab July 26, 2019 1855 07/13/19 0517  APTT 53* 30  INR 1.0 1.1    Sepsis Markers Recent Labs  Lab 07/26/2019 1015  LATICACIDVEN 1.6     Georgann Housekeeper, AGACNP-BC [place] for personal pager PCCM on call pager [phone]  07/13/2019 8:59 AM

## 2019-07-13 NOTE — Progress Notes (Signed)
eLink Physician-Brief Progress Note Patient Name: Karina Sloan DOB: 12-08-1940 MRN: [id number]   Date of Service  07/13/2019  HPI/Events of Note  hypoglycemia  eICU Interventions  D 10 at 50 ml/ hour ordered        Nicklas Mcsweeney U Porter Moes 07/13/2019, 2:10 AM

## 2019-07-13 NOTE — Progress Notes (Signed)
  Echocardiogram 2D Echocardiogram has been performed.  Karina Sloan 07/13/2019, 9:40 AM

## 2019-07-13 NOTE — Procedures (Signed)
Patient Name: [name]  MRN: [id number]  Epilepsy Attending: Charlsie Quest  Referring Physician/Provider: Joneen Roach, NP  Date: 07-22-2019  Duration: 20.56 minutes  Patient history: 79 year old female status post cardiac arrest on TTM.  EEG evaluate for seizures.  Level of alertness: Comatose  AEDs during EEG study: None  Technical aspects: This EEG study was done with scalp electrodes positioned according to the 10-20 International system of electrode placement. Electrical activity was acquired at a sampling rate of 500Hz  and reviewed with a high frequency filter of 70Hz  and a low frequency filter of 1Hz . EEG data were recorded continuously and digitally stored.   Description: EEG showed continuous generalized 3 to 5 Hz theta-delta slowing.  Reactivity to noxious stimuli was not tested during the study.  Hyperventilation and photic stimulation were not performed.  Abnormality -Continuous slow, generalized  IMPRESSION: This study is suggestive of severe diffuse encephalopathy, nonspecific to etiology. No seizures or epileptiform discharges were seen throughout the recording.  Tequisha Maahs 

## 2019-07-13 NOTE — Consult Note (Signed)
WOC Nurse Consult Note: CArdiac arrest R/T hyperkalemia.  Chronic nonhealing wounds to bilateral buttocks and sacrum.  Nonhealing vascular wounds to bilateral feet.  Right transmetatarsal amputation site, unhealed and exposed tendon to dorsal foot.  Reason for Consult:wound care to chronic nonhealing wounds Wound type:pressure and vascular Pressure Injury POA: Yes Measurement: Stage 2 to buttocks and sacrum:  0.3 cm open pink moist wounds Right dorsal foot:  3 cm x 8 cm nonhealing surgical wound with eschar in center measuring 4 cm x 4 cm  Tendon visible in wound bed. Tendon is dry and darkened.  LEft foot:  3 cmx 3 cm necrotic open wound Wound bed:see above Drainage (amount, consistency, odor) minimal serosanguinous  Necrotic odor  Periwound: cool feet.  Dressing procedure/placement/frequency: Cleanse sacrum with soap and water and pat dry.  Apply silicone foam dressing. Change every three days.  CLeanse bilateral feet with NS and pat dry. Apply santyl to necrotic wounds.  Top with NS moist gauze. Secure with ABD Pad and kerlix/tape.  Change daily.  Will not follow at this time.  Please re-consult if needed.  Maple Hudson MSN, RN, FNP-BC CWON Wound, Ostomy, Continence Nurse Pager [phone]

## 2019-07-14 ENCOUNTER — Inpatient Hospital Stay (HOSPITAL_COMMUNITY): Payer: Medicare HMO

## 2019-07-14 DIAGNOSIS — J9620 Acute and chronic respiratory failure, unspecified whether with hypoxia or hypercapnia: Secondary | ICD-10-CM | POA: Diagnosis present

## 2019-07-14 LAB — CBC
HCT: 23.8 % — ABNORMAL LOW (ref 36.0–46.0)
Hemoglobin: 7 g/dL — ABNORMAL LOW (ref 12.0–15.0)
MCH: 22.6 pg — ABNORMAL LOW (ref 26.0–34.0)
MCHC: 29.4 g/dL — ABNORMAL LOW (ref 30.0–36.0)
MCV: 76.8 fL — ABNORMAL LOW (ref 80.0–100.0)
Platelets: 494 10*3/uL — ABNORMAL HIGH (ref 150–400)
RBC: 3.1 MIL/uL — ABNORMAL LOW (ref 3.87–5.11)
RDW: 18.8 % — ABNORMAL HIGH (ref 11.5–15.5)
WBC: 23.4 10*3/uL — ABNORMAL HIGH (ref 4.0–10.5)
nRBC: 0 % (ref 0.0–0.2)

## 2019-07-14 LAB — GLUCOSE, CAPILLARY
Glucose-Capillary: 183 mg/dL — ABNORMAL HIGH (ref 70–99)
Glucose-Capillary: 121 mg/dL — ABNORMAL HIGH (ref 70–99)
Glucose-Capillary: 151 mg/dL — ABNORMAL HIGH (ref 70–99)
Glucose-Capillary: 156 mg/dL — ABNORMAL HIGH (ref 70–99)
Glucose-Capillary: 160 mg/dL — ABNORMAL HIGH (ref 70–99)

## 2019-07-14 LAB — BASIC METABOLIC PANEL
Anion gap: 13 (ref 5–15)
BUN: 35 mg/dL — ABNORMAL HIGH (ref 8–23)
CO2: 17 mmol/L — ABNORMAL LOW (ref 22–32)
Calcium: 8.1 mg/dL — ABNORMAL LOW (ref 8.9–10.3)
Chloride: 106 mmol/L (ref 98–111)
Creatinine, Ser: 1.16 mg/dL — ABNORMAL HIGH (ref 0.44–1.00)
GFR calc Af Amer: 51 mL/min — ABNORMAL LOW (ref >60)
GFR calc non Af Amer: 44 mL/min — ABNORMAL LOW (ref >60)
Glucose, Bld: 156 mg/dL — ABNORMAL HIGH (ref 70–99)
Potassium: 4.3 mmol/L (ref 3.5–5.1)
Sodium: 136 mmol/L (ref 135–145)

## 2019-07-14 LAB — MAGNESIUM: Magnesium: 1.9 mg/dL (ref 1.7–2.4)

## 2019-07-14 LAB — PHOSPHORUS: Phosphorus: 2.6 mg/dL (ref 2.5–4.6)

## 2019-07-14 NOTE — Progress Notes (Signed)
eLink Physician-Brief Progress Note Patient Name: Karina Sloan DOB: 03-Jun-1940 MRN: [id number]   Date of Service  07/14/2019  HPI/Events of Note  Pt's enteral nutrition is now at goal and blood sugar is 130 mg %  eICU Interventions  D 10 W infusion discontinued.        Thomasene Lot Leray Garverick 07/14/2019, 2:22 AM

## 2019-07-14 NOTE — Procedures (Addendum)
Patient Name:Karina Sloan LLI:[id number] Epilepsy Attending:Sophee Mckimmy Annabelle Harman Referring Physician/Provider:Paul Mikey Bussing, NP Duration:07/13/2019 1900 to 07/14/2019 1900  Patient history:79 year old female status post cardiac arrest on TTM. EEG evaluate for seizures.  Level of alertness:Comatose  AEDs during EEG study:None  Technical aspects: This EEG study was done with scalp electrodes positioned according to the 10-20 International system of electrode placement. Electrical activity was acquired at a sampling rate of 500Hz  and reviewed with a high frequency filter of 70Hz  and a low frequency filter of 1Hz . EEG data were recorded continuously and digitally stored.  Description:EEG showed continuous generalized low amplitude 3 to 5 Hz theta-delta slowing. EEG was reactive to tactile stimuli. Sharp transients were seen in vertex region.Hyperventilation and photic stimulation were not performed.  Abnormality -Continuous slow, generalized   IMPRESSION: This study issuggestive of severe diffuse encephalopathy, nonspecific to etiology. No seizures or epileptiform discharges were seen throughout the recording.  EEG appears to be improving compared to previous study.  [name] 

## 2019-07-14 NOTE — Progress Notes (Signed)
LTM maint complete - no skin breakdown under:  fp1, f7, A1, C3

## 2019-07-14 NOTE — Progress Notes (Signed)
PULMONARY / CRITICAL CARE MEDICINE   NAME:  Karina Sloan, MRN:  [id number], DOB:  11-03-1940, LOS: 2 ADMISSION DATE:  06/25/2019, CONSULTATION DATE: 07/11/2019 REFERRING MD: Emergency department physician, CHIEF COMPLAINT: Status post bradycardic event  BRIEF HISTORY:    79 year old female found pulseless at SNF and had short CPR by EMS. Unresponsive in ED and 36C TTM started.   HISTORY OF PRESENT ILLNESS   79 year old female with a plethora of health issues he was transferred from [facility] to a skilled nursing facility in [facility].  She is a resident of [place].  COVID-19 diagnosed 06/15/19.  She also suffers from diabetes with vascular insufficiency of the lower extremities requiring surgical interventions.  Reported to have dementia.  Reported to have hypertension.  She also suffers from anemia, delirium, bilateral carotid stenosis, acute and chronic lower GI bleeding, congestive heart failure, acute osteomyelitis of left foot.  She has AV malformation. She was found to in the skilled nursing facility on med pass by the nurse to be unresponsive.  She noted no radial pulse. EMS was activated and noted no pulse, started CPR on their arrival. She is placed on epi drip transcutaneous pacing airway protection and transported to [facility] emergency department, reportedly she has been intubated and she is now off epinephrine and transcutaneous pacer at this time.  Reported to be a full code at this time.  Pulmonary critical care asked to admit.  SIGNIFICANT PAST MEDICAL HISTORY   Mobitz type II heart block, Diabetes, Dementia, Vascular insufficiency secondary to diabetes History of COVID-19 06/15/2019  SIGNIFICANT EVENTS:  07/06/2019 admitted to [place]:   CT head 2/18 > nonacute EEG 2/18 > TTE 2/18:IMPRESSIONS  1. Left ventricular ejection fraction, by estimation, is 60 to 65%. The  left ventricle has normal function. The left  ventricle has no regional  wall motion abnormalities. There is mild concentric left ventricular  hypertrophy. Left ventricular diastolic  parameters are indeterminate.  2. Right ventricular systolic function is normal. The right ventricular  size is normal. Tricuspid regurgitation signal is inadequate for assessing  PA pressure.  3. The mitral valve is grossly normal. Trivial mitral valve  regurgitation. No evidence of mitral stenosis.  4. The aortic valve is tricuspid. Aortic valve regurgitation is not  visualized. No aortic stenosis is present.  5. There is mild (Grade II) layered plaque involving the aortic root.   CULTURES:  COVID 1/22 and 2/18 positive Blood 2/18 > Urine 2/18 > GNR; sens pending Sputum 2/18 > reincubated  ANTIBIOTICS:  Cefepime 2/18 > Vanco 2/18 >  LINES/TUBES:  07/05/2019 endotracheal tube >  CONSULTANTS:    SUBJECTIVE:  No acute events o/n. EEG ongoing but appears to demonstrate diffuse slowing consistent with encephalopathy   CONSTITUTIONAL: BP (!) 146/51   Pulse 74   Temp 98.4 F (36.9 C) (Bladder)   Resp 18   Ht 5\' 8"  (1.727 m)   Wt 66.4 kg   SpO2 100%   BMI 22.26 kg/m   I/O last 3 completed shifts: In: 3612.2 [I.V.:2999.8; NG/GT:262.5; IV Piggyback:349.9] Out: 2230 [Urine:2080; Emesis/NG output:50; Other:100]     Vent Mode: PSV;CPAP FiO2 (%):  [30 %] 30 % Set Rate:  [16 bmp] 16 bmp Vt Set:  [510 mL] 510 mL PEEP:  [5 cmH20] 5 cmH20 Pressure Support:  [5 cmH20] 5 cmH20 Plateau Pressure:  [12 [id number] cmH20] 18 cmH20  PHYSICAL EXAM: General:  Frail elderly female on vent Neuro:  +cough/gag. Weak  corneal.  PERRL, pupils 3 mm sluggish. HEENT:  [place]/AT, EEG contacts in place.  Cardiovascular:  RRR, no r/m/g Lungs: CTAB Abdomen:  Soft, non-distended.  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 Several open wounds on both feet and heels as below.    R foot                                           R  heel    L foot                                  L heel     RESOLVED PROBLEM LIST   ASSESSMENT AND PLAN    Acute hypoxemic respiratory failure in the setting of encephalopathy and obtundation s/p cardiac arrest. Possible PNA. CXR reviewed personally 2/19 shows new L sided infiltrate.  - Full vent support - ABG PRN - Follow CXR - SBT per protocol - ABX as above- On CPAP this a.m. with good TV,   Cardiac arrest: Etiology unclear. Hyperkalemia? Primary arrhythmia with known history of 2nd degree block? Unknown downtime resuscitated by EMS to sinus bradycardia requiring epinephrine and TC pacing.  Both of which have now been stopped successfully. - 36C TTM per protocol - Echo pending - ICU hemodynamic monitoring.  Shock: cardiogenic vs septic  - Echo pending - May need CVL. Levo off  History of second degree AV block: Bradycardic immediately post code. Sinus at time of admission. No further arrhythmia notes since admission.  - Telemetry monitoring - Consider cardiology consult pending her clinical improvement.   Acute metabolic encephalopathy: at significant risk for anoxic injury - PRN sedation only for RASS goal 0 to -1.  - EEG shows diffuse encephalopathy w/o sz focus - Neuro-checks   Urosepsis: presumed. Cannot rule out HCAP - cx with GNR, sens pending - Cefepime and vancomycin per pharmacy consult.   AKI no available baseline creatinine. 1.8 on admission.  Hyperkalemia 7.3 on admit. Slowly trending down.  - BMP  Trend. K 4.3 today  Diabetes mellitus Hypoglycemia - sliding scale insulin - CBG monitoring   Multiple wounds: dorsal surface of both feet and bilateral heels. Small sacral decub. All present on admission. Pictured above. None are obviously infected.  - WOC consulted, reccs noted and being followed.  COVID 19: diagnosed 1/22. Now greater than 21 days post diagnosis. Repeat 2/18 POSITIVE  Malnutrition - Start TF  Best Practice / Goals of Care /  Disposition.   DVT PROPHYLAXIS: in place SUP: PPI, HOB>30 NUTRITION: tube feeds at goal MOBILITY: Bedrest GOALS OF CARE: DNR FAMILY DISCUSSIONS: Son updated via phone  DISPOSITION ICU  LABS  Glucose Recent Labs  Lab 07/13/19 1120 07/13/19 1607 07/13/19 1926 07/13/19 2325 07/14/19 0335 07/14/19 0743  GLUCAP 114* 73 121* 133* 183* 121*    BMET Recent Labs  Lab July 22, 2019 2212 July 22, 2019 2226 07/13/19 0143 07/13/19 0517 07/14/19 0528  NA 139   < > 139 139 136  K 6.6*   < > 5.5* 6.1* 4.3  CL 109  --   --  109 106  CO2 15*  --   --  16* 17*  BUN 41*  --   --  41* 35*  CREATININE 1.37*  --   --  1.22* 1.16*  GLUCOSE 31*  --   --  93 156*   < > = values in this interval not displayed.    Liver Enzymes Recent Labs  Lab 07/19/2019 1015  AST 25  ALT 8  ALKPHOS 181*  BILITOT 1.2  ALBUMIN 1.8*    Electrolytes Recent Labs  Lab 07/03/2019 1700 07/06/2019 2212 07/13/19 0517 07/13/19 1621 07/14/19 0528  CALCIUM  --  9.1 8.6*  --  8.1*  MG   < >  --  2.0 2.0 1.9  PHOS   < >  --  3.9 3.4 2.6   < > = values in this interval not displayed.    CBC Recent Labs  Lab 07/13/2019 2212 07/06/2019 2226 07/13/19 0143 07/13/19 0517 07/14/19 0528  WBC 22.1*  --   --  28.2* 23.4*  HGB 8.0*   < > 6.8* 7.5* 7.0*  HCT 27.4*   < > 20.0* 25.6* 23.8*  PLT 589*  --   --  588* 494*   < > = values in this interval not displayed.    ABG Recent Labs  Lab 06/30/2019 1121 07/16/2019 2226 07/13/19 0143  PHART 7.282* 7.439 7.408  PCO2ART 51.1* 26.4* 29.5*  PO2ART 523.0* 124.0* 106.0    Coag's Recent Labs  Lab 07/10/2019 1855 07/13/19 0517  APTT 53* 30  INR 1.0 1.1    Sepsis Markers Recent Labs  Lab 07/11/2019 1015  LATICACIDVEN 1.6    I have independently seen and examined the patient, reviewed data, and developed an assessment and plan. A total of 39 minutes were spent in critical care assessment and medical decision making. This critical care time does not reflect procedure  time, or teaching time or supervisory time of PA/NP/Med student/Med Resident, etc but could involve care discussion time.  Gwynne Edinger, MD PhD 07/14/2019 10:16 AM

## 2019-07-14 NOTE — Progress Notes (Signed)
Patient placed on CPAP/PS and tolerating better than full support at this time. RN and MD aware.

## 2019-07-15 DIAGNOSIS — J9690 Respiratory failure, unspecified, unspecified whether with hypoxia or hypercapnia: Secondary | ICD-10-CM | POA: Insufficient documentation

## 2019-07-15 LAB — COMPREHENSIVE METABOLIC PANEL
ALT: 11 U/L (ref 0–44)
AST: 19 U/L (ref 15–41)
Albumin: 1.2 g/dL — ABNORMAL LOW (ref 3.5–5.0)
Alkaline Phosphatase: 170 U/L — ABNORMAL HIGH (ref 38–126)
Anion gap: 9 (ref 5–15)
BUN: 42 mg/dL — ABNORMAL HIGH (ref 8–23)
CO2: 19 mmol/L — ABNORMAL LOW (ref 22–32)
Calcium: 7.9 mg/dL — ABNORMAL LOW (ref 8.9–10.3)
Chloride: 112 mmol/L — ABNORMAL HIGH (ref 98–111)
Creatinine, Ser: 0.86 mg/dL (ref 0.44–1.00)
GFR calc Af Amer: >60 mL/min (ref >60)
GFR calc non Af Amer: >60 mL/min (ref >60)
Glucose, Bld: 158 mg/dL — ABNORMAL HIGH (ref 70–99)
Potassium: 3.7 mmol/L (ref 3.5–5.1)
Sodium: 140 mmol/L (ref 135–145)
Total Bilirubin: 0.3 mg/dL (ref 0.3–1.2)
Total Protein: 6.1 g/dL — ABNORMAL LOW (ref 6.5–8.1)

## 2019-07-15 LAB — GLUCOSE, CAPILLARY
Glucose-Capillary: 136 mg/dL — ABNORMAL HIGH (ref 70–99)
Glucose-Capillary: 142 mg/dL — ABNORMAL HIGH (ref 70–99)
Glucose-Capillary: 149 mg/dL — ABNORMAL HIGH (ref 70–99)
Glucose-Capillary: 151 mg/dL — ABNORMAL HIGH (ref 70–99)
Glucose-Capillary: 165 mg/dL — ABNORMAL HIGH (ref 70–99)
Glucose-Capillary: 171 mg/dL — ABNORMAL HIGH (ref 70–99)
Glucose-Capillary: 187 mg/dL — ABNORMAL HIGH (ref 70–99)

## 2019-07-15 LAB — CBC WITH DIFFERENTIAL/PLATELET
Abs Immature Granulocytes: 0.18 10*3/uL — ABNORMAL HIGH (ref 0.00–0.07)
Basophils Absolute: 0 10*3/uL (ref 0.0–0.1)
Basophils Relative: 0 %
Eosinophils Absolute: 0 10*3/uL (ref 0.0–0.5)
Eosinophils Relative: 0 %
HCT: 21.1 % — ABNORMAL LOW (ref 36.0–46.0)
Hemoglobin: 6.2 g/dL — CL (ref 12.0–15.0)
Immature Granulocytes: 1 %
Lymphocytes Relative: 4 %
Lymphs Abs: 0.8 10*3/uL (ref 0.7–4.0)
MCH: 23.1 pg — ABNORMAL LOW (ref 26.0–34.0)
MCHC: 29.4 g/dL — ABNORMAL LOW (ref 30.0–36.0)
MCV: 78.7 fL — ABNORMAL LOW (ref 80.0–100.0)
Monocytes Absolute: 1.1 10*3/uL — ABNORMAL HIGH (ref 0.1–1.0)
Monocytes Relative: 5 %
Neutro Abs: 19.6 10*3/uL — ABNORMAL HIGH (ref 1.7–7.7)
Neutrophils Relative %: 90 %
Platelets: 411 10*3/uL — ABNORMAL HIGH (ref 150–400)
RBC: 2.68 MIL/uL — ABNORMAL LOW (ref 3.87–5.11)
RDW: 19.1 % — ABNORMAL HIGH (ref 11.5–15.5)
WBC: 21.7 10*3/uL — ABNORMAL HIGH (ref 4.0–10.5)
nRBC: 0.3 % — ABNORMAL HIGH (ref 0.0–0.2)

## 2019-07-15 LAB — URINE CULTURE

## 2019-07-15 LAB — CULTURE, RESPIRATORY W GRAM STAIN: Culture: NORMAL

## 2019-07-15 LAB — HEMOGLOBIN AND HEMATOCRIT, BLOOD
HCT: 26.2 % — ABNORMAL LOW (ref 36.0–46.0)
Hemoglobin: 8.2 g/dL — ABNORMAL LOW (ref 12.0–15.0)

## 2019-07-15 LAB — PREPARE RBC (CROSSMATCH)

## 2019-07-15 MED ORDER — SODIUM CHLORIDE 0.9% IV SOLUTION: Freq: Once | INTRAVENOUS | Status: AC

## 2019-07-15 NOTE — Progress Notes (Signed)
PULMONARY / CRITICAL CARE MEDICINE   NAME:  Karina Sloan, MRN:  [id number], DOB:  [birth year], LOS: 3 ADMISSION DATE:  30-Jul-2019, CONSULTATION DATE: 2019/07/30 REFERRING MD: Emergency department physician, CHIEF COMPLAINT: Status post bradycardic event  BRIEF HISTORY:    79 year old female found pulseless at SNF and had short CPR by EMS. Unresponsive in ED and 36C TTM started.   HISTORY OF PRESENT ILLNESS   79 year old female with a plethora of health issues he was transferred from [facility] to a skilled nursing facility in [facility].  She is a resident of [place].  COVID-19 diagnosed 06/15/19.  She also suffers from diabetes with vascular insufficiency of the lower extremities requiring surgical interventions.  Reported to have dementia.  Reported to have hypertension.  She also suffers from anemia, delirium, bilateral carotid stenosis, acute and chronic lower GI bleeding, congestive heart failure, acute osteomyelitis of left foot.  She has AV malformation. She was found to in the skilled nursing facility on med pass by the nurse to be unresponsive.  She noted no radial pulse. EMS was activated and noted no pulse, started CPR on their arrival. She is placed on epi drip transcutaneous pacing airway protection and transported to [facility] emergency department, reportedly she has been intubated and she is now off epinephrine and transcutaneous pacer at this time.  Reported to be a full code at this time.  Pulmonary critical care asked to admit.  SIGNIFICANT PAST MEDICAL HISTORY   Mobitz type II heart block, Diabetes, Dementia, Vascular insufficiency secondary to diabetes History of COVID-19 06/15/2019  SIGNIFICANT EVENTS:  2019/07/30 admitted to [place]:   CT head 2/18 > nonacute EEG 2/18 > TTE 2/18:IMPRESSIONS  1. Left ventricular ejection fraction, by estimation, is 60 to 65%. The  left ventricle has normal function. The left  ventricle has no regional  wall motion abnormalities. There is mild concentric left ventricular  hypertrophy. Left ventricular diastolic  parameters are indeterminate.  2. Right ventricular systolic function is normal. The right ventricular  size is normal. Tricuspid regurgitation signal is inadequate for assessing  PA pressure.  3. The mitral valve is grossly normal. Trivial mitral valve  regurgitation. No evidence of mitral stenosis.  4. The aortic valve is tricuspid. Aortic valve regurgitation is not  visualized. No aortic stenosis is present.  5. There is mild (Grade II) layered plaque involving the aortic root.   CULTURES:  COVID 1/22 and 2/18 positive Blood 2/18 > Urine 2/18 > GNR; sens pending Sputum 2/18 > reincubated  ANTIBIOTICS:  Cefepime 2/18 > Vanco 2/18 >  LINES/TUBES:  2019/07/30 endotracheal tube >  CONSULTANTS:    SUBJECTIVE:  No acute events o/n. EEG ongoing but appears to demonstrate diffuse slowing consistent with encephalopathy   CONSTITUTIONAL: BP (!) 135/42   Pulse (!) 56   Temp 98.4 F (36.9 C) (Bladder)   Resp 16   Ht 5\' 8"  (1.727 m)   Wt 66.6 kg   SpO2 100%   BMI 22.33 kg/m   I/O last 3 completed shifts: In: 3811.9 [I.V.:1885.9; NG/GT:1440; IV Piggyback:486] Out: 2536 [Urine:2360; Other:150]     Vent Mode: SIMV/PC/PS FiO2 (%):  [30 %] 30 % Set Rate:  [16 bmp] 16 bmp PEEP:  [5 cmH20] 5 cmH20 Pressure Support:  [10 cmH20] 10 cmH20 Plateau Pressure:  [12 UYQ03-47 cmH20] 18 cmH20  PHYSICAL EXAM: General:  Frail elderly female on vent Neuro:  +cough/gag, but weak. Weak corneal.  PERRL, pupils 3 mm  sluggish. Deviant gaze. HEENT:  [place]/AT, EEG contacts in place.  Cardiovascular:  RRR, no r/m/g Lungs: CTAB Abdomen:  Soft, non-distended.  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 Several open wounds on both feet and heels as below.    R foot                                           R heel    L foot                                   L heel     RESOLVED PROBLEM LIST   ASSESSMENT AND PLAN    Acute hypoxemic respiratory failure in the setting of encephalopathy and obtundation s/p cardiac arrest. Possible PNA. CXR reviewed personally 2/19 shows new L sided infiltrate.  - Full vent support - ABG PRN - Follow CXR - SBT per protocol - ABX as above-   Cardiac arrest: Etiology unclear. Hyperkalemia? Primary arrhythmia with known history of 2nd degree block? Unknown downtime resuscitated by EMS to sinus bradycardia requiring epinephrine and TC pacing.  Both of which have now been stopped successfully. - 36C TTM per protocol - Echo pending - ICU hemodynamic monitoring.  Shock: cardiogenic vs septic - Echo pending - May need CVL. Levo off  History of second degree AV block: Bradycardic immediately post code. Sinus at time of admission. No further arrhythmia notes since admission.  - Telemetry monitoring - Consider cardiology consult pending her clinical improvement.   Acute metabolic encephalopathy: at significant risk for anoxic injury - PRN sedation only for RASS goal 0 to -1.  - EEG shows diffuse encephalopathy w/o sz focus - Neuro-checks  Anemia: Hgb has been trending down; 6.2 this a.m -will transfuse 2U PRBC, repeat H/H -no obvious active site of bleeding, ie. No blood in feces, urine, OG tube; dressings dry.  --overall outlook for survival is not good, but if continues can consider further investigation  Urosepsis: presumed. Cannot rule out HCAP - cx with GNR, sens pending - Cefepime and vancomycin per pharmacy consult.   AKI no available baseline creatinine. 1.8 on admission.  Hyperkalemia 7.3 on admit. Slowly trending down.  - BMP  Trend. K 4.3 today  Diabetes mellitus Hypoglycemia - sliding scale insulin - CBG monitoring   Multiple wounds: dorsal surface of both feet and bilateral heels. Small sacral decub. All present on admission. Pictured above. None are  obviously infected.  - WOC consulted, reccs noted and being followed.  COVID 19: diagnosed 1/22.  Repeat 2/18 POSITIVE  Malnutrition - continueTF  Best Practice / Goals of Care / Disposition.   DVT PROPHYLAXIS: in place SUP: PPI, HOB>30 NUTRITION: tube feeds at goal MOBILITY: Bedrest GOALS OF CARE: DNR FAMILY DISCUSSIONS: Son updated via phone  DISPOSITION ICU  LABS  Glucose Recent Labs  Lab 07/14/19 1217 07/14/19 1659 07/14/19 2024 07/15/19 0010 07/15/19 0343 07/15/19 0736  GLUCAP 151* 156* 160* 187* 142* 149*    BMET Recent Labs  Lab 07/13/19 0517 07/14/19 0528 07/15/19 0816  NA 139 136 140  K 6.1* 4.3 3.7  CL 109 106 112*  CO2 16* 17* 19*  BUN 41* 35* 42*  CREATININE 1.22* 1.16* 0.86  GLUCOSE 93 156* 158*    Liver Enzymes Recent Labs  Lab 06/29/2019 1015  07/15/19 0816  AST 25 19  ALT 8 11  ALKPHOS 181* 170*  BILITOT 1.2 0.3  ALBUMIN 1.8* 1.2*    Electrolytes Recent Labs  Lab 07/13/19 0517 07/13/19 1621 07/14/19 0528 07/15/19 0816  CALCIUM 8.6*  --  8.1* 7.9*  MG 2.0 2.0 1.9  --   PHOS 3.9 3.4 2.6  --     CBC Recent Labs  Lab 07/13/19 0517 07/14/19 0528 07/15/19 0816  WBC 28.2* 23.4* 21.7*  HGB 7.5* 7.0* 6.2*  HCT 25.6* 23.8* 21.1*  PLT 588* 494* 411*    ABG Recent Labs  Lab 07/22/2019 1121 07/19/2019 2226 07/13/19 0143  PHART 7.282* 7.439 7.408  PCO2ART 51.1* 26.4* 29.5*  PO2ART 523.0* 124.0* 106.0    Coag's Recent Labs  Lab 07/04/2019 1855 07/13/19 0517  APTT 53* 30  INR 1.0 1.1    Sepsis Markers Recent Labs  Lab 07/19/2019 1015  LATICACIDVEN 1.6    I have independently seen and examined the patient, reviewed data, and developed an assessment and plan. A total of 39 minutes were spent in critical care assessment and medical decision making. This critical care time does not reflect procedure time, or teaching time or supervisory time of PA/NP/Med student/Med Resident, etc but could involve care discussion time.   Gwynne Edinger, MD PhD 07/15/2019 10:16 AM

## 2019-07-15 NOTE — Progress Notes (Signed)
LTM EEG discontinued - no skin breakdown at unhook.   

## 2019-07-15 NOTE — Procedures (Addendum)
Patient Name:Karina Sloan PNP:[id number] Epilepsy Attending:Elzora Cullins Annabelle Harman Referring Physician/Provider:Paul Mikey Bussing, NP Duration:07/14/2019 1900 to 2/21/[id number]  Patient history:79 year old female status post cardiac arrest on TTM. EEG evaluate for seizures.  Level of alertness:Comatose  AEDs during EEG study:None  Technical aspects: This EEG study was done with scalp electrodes positioned according to the 10-20 International system of electrode placement. Electrical activity was acquired at a sampling rate of 500Hz  and reviewed with a high frequency filter of 70Hz  and a low frequency filter of 1Hz . EEG data were recorded continuously and digitally stored.  Description:EEG showed continuous generalizedlow amplitude3 to 5 Hz theta-delta slowing.EEG was reactive to tactile stimuli. Sharp transients were seen in vertex region.Hyperventilation and photic stimulation were not performed.  Abnormality -Continuous slow, generalized   IMPRESSION: This study issuggestive of severe diffuse encephalopathy, nonspecific to etiology. No seizures or epileptiform discharges were seen throughout the recording.  EEG appears similar to previous study.  [name] 

## 2019-07-16 LAB — CBC WITH DIFFERENTIAL/PLATELET
Abs Immature Granulocytes: 0.2 10*3/uL — ABNORMAL HIGH (ref 0.00–0.07)
Basophils Absolute: 0 10*3/uL (ref 0.0–0.1)
Basophils Relative: 0 %
Eosinophils Absolute: 0 10*3/uL (ref 0.0–0.5)
Eosinophils Relative: 0 %
HCT: 29 % — ABNORMAL LOW (ref 36.0–46.0)
Hemoglobin: 9.2 g/dL — ABNORMAL LOW (ref 12.0–15.0)
Immature Granulocytes: 1 %
Lymphocytes Relative: 4 %
Lymphs Abs: 0.7 10*3/uL (ref 0.7–4.0)
MCH: 25.1 pg — ABNORMAL LOW (ref 26.0–34.0)
MCHC: 31.7 g/dL (ref 30.0–36.0)
MCV: 79.2 fL — ABNORMAL LOW (ref 80.0–100.0)
Monocytes Absolute: 1 10*3/uL (ref 0.1–1.0)
Monocytes Relative: 5 %
Neutro Abs: 16.9 10*3/uL — ABNORMAL HIGH (ref 1.7–7.7)
Neutrophils Relative %: 90 %
Platelets: 385 10*3/uL (ref 150–400)
RBC: 3.66 MIL/uL — ABNORMAL LOW (ref 3.87–5.11)
RDW: 19 % — ABNORMAL HIGH (ref 11.5–15.5)
WBC: 18.8 10*3/uL — ABNORMAL HIGH (ref 4.0–10.5)
nRBC: 0.3 % — ABNORMAL HIGH (ref 0.0–0.2)

## 2019-07-16 LAB — TYPE AND SCREEN
ABO/RH(D): O POS
Antibody Screen: NEGATIVE
Unit division: 0
Unit division: 0

## 2019-07-16 LAB — BPAM RBC
Blood Product Expiration Date: 202103192359
Blood Product Expiration Date: 202103192359
ISSUE DATE / TIME: 202102210943
ISSUE DATE / TIME: 202102211113
Unit Type and Rh: 5100
Unit Type and Rh: 5100

## 2019-07-16 LAB — COMPREHENSIVE METABOLIC PANEL
ALT: 16 U/L (ref 0–44)
AST: 26 U/L (ref 15–41)
Albumin: 1.3 g/dL — ABNORMAL LOW (ref 3.5–5.0)
Alkaline Phosphatase: 207 U/L — ABNORMAL HIGH (ref 38–126)
Anion gap: 10 (ref 5–15)
BUN: 38 mg/dL — ABNORMAL HIGH (ref 8–23)
CO2: 19 mmol/L — ABNORMAL LOW (ref 22–32)
Calcium: 8.2 mg/dL — ABNORMAL LOW (ref 8.9–10.3)
Chloride: 113 mmol/L — ABNORMAL HIGH (ref 98–111)
Creatinine, Ser: 0.79 mg/dL (ref 0.44–1.00)
GFR calc Af Amer: >60 mL/min (ref >60)
GFR calc non Af Amer: >60 mL/min (ref >60)
Glucose, Bld: 167 mg/dL — ABNORMAL HIGH (ref 70–99)
Potassium: 3.2 mmol/L — ABNORMAL LOW (ref 3.5–5.1)
Sodium: 142 mmol/L (ref 135–145)
Total Bilirubin: <0.1 mg/dL — ABNORMAL LOW (ref 0.3–1.2)
Total Protein: 6.1 g/dL — ABNORMAL LOW (ref 6.5–8.1)

## 2019-07-16 LAB — GLUCOSE, CAPILLARY
Glucose-Capillary: 149 mg/dL — ABNORMAL HIGH (ref 70–99)
Glucose-Capillary: 168 mg/dL — ABNORMAL HIGH (ref 70–99)
Glucose-Capillary: 167 mg/dL — ABNORMAL HIGH (ref 70–99)
Glucose-Capillary: 202 mg/dL — ABNORMAL HIGH (ref 70–99)
Glucose-Capillary: 164 mg/dL — ABNORMAL HIGH (ref 70–99)
Glucose-Capillary: 184 mg/dL — ABNORMAL HIGH (ref 70–99)

## 2019-07-16 MED ORDER — POTASSIUM CHLORIDE 20 MEQ/15ML (10%) PO SOLN
30.0000 meq | ORAL | Status: AC
  Administered 2019-07-16 (×2): 30 meq
  Filled 2019-07-16 (×2): qty 30

## 2019-07-16 NOTE — Progress Notes (Signed)
[facility] ADULT ICU REPLACEMENT PROTOCOL FOR AM LAB REPLACEMENT ONLY  The patient does apply for the [facility] Adult ICU Electrolyte Replacment Protocol based on the criteria listed below:   1. Is GFR >/= 40 ml/min? Yes.    Patient's GFR today is >60 2. Is urine output >/= 0.5 ml/kg/hr for the last 6 hours? Yes.   Patient's UOP is 1.87 ml/kg/hr 3. Is BUN < 60 mg/dL? Yes.    Patient's BUN today is 38 4. Abnormal electrolyte(s): K+3.2 5. Ordered repletion with: protocol 6. If a panic level lab has been reported, has the CCM MD in charge been notified? Yes.  .   Physician:  Olena Mater 07/16/2019 6:01 AM

## 2019-07-16 NOTE — Progress Notes (Signed)
eLink Physician-Brief Progress Note Patient Name: Karina Sloan DOB: [birth year] MRN: [id number]   Date of Service  07/16/2019  HPI/Events of Note  Notified of episodes of bradycardia 40s with BO elevated. Patient is now off epinephrine and pacer  eICU Interventions  No further intervention at this time. If bradycardia persistent and with decrease BP may resume transcutaneous pacing     Intervention Category Major Interventions: Arrhythmia - evaluation and management  Rosalie Gums Neythan Kozlov 07/16/2019, 2:26 AM

## 2019-07-16 NOTE — Progress Notes (Signed)
eLink Physician-Brief Progress Note Patient Name: Novelle Addair DOB: [birth year] MRN: [id number]   Date of Service  07/16/2019  HPI/Events of Note  Notified of calcium 8.2  eICU Interventions  Corrected calcium 10.2. No further invertention     Intervention Category Major Interventions: Electrolyte abnormality - evaluation and management  Darl Pikes 07/16/2019, 6:10 AM

## 2019-07-16 NOTE — Progress Notes (Addendum)
Pharmacy Antibiotic Note  Karina Sloan is a 79 y.o. female admitted on 2019-08-02 with altered mental status s/p CPR and presumed urosepsis. COVID + on 06/15/19. Pharmacy has been consulted for cefepime dosing (vancomycin discontinued 2/22) -SCr= 0.8, CrCl ~ 55 -WBC= 28.2   Plan: Continue Cefepime  2gm IV q12h follow renal function, cultures, vanc levels and clinical progress  Height: 5\' 8"  (172.7 cm) Weight: 150 lb 13.8 oz (68.4 kg) IBW/kg (Calculated) : 63.9  Temp (24hrs), Avg:98.5 F (36.9 C), Min:98.1 F (36.7 C), Max:98.6 F (37 C)  Recent Labs  Lab August 02, 2019 1015 08/02/2019 1855 08/02/2019 2212 07/13/19 0517 07/14/19 0528 07/15/19 0816 07/16/19 0200  WBC 13.6*   < > 22.1* 28.2* 23.4* 21.7* 18.8*  CREATININE 1.82*  --  1.37* 1.22* 1.16* 0.86 0.79  LATICACIDVEN 1.6  --   --   --   --   --   --    < > = values in this interval not displayed.    Estimated Creatinine Clearance: 56.6 mL/min (by C-G formula based on SCr of 0.79 mg/dL).    No Known Allergies  Antimicrobials this admission: Vancomycin 2/18 >> 2/22 Cefepime 2/18 >>  Ceftriaxone x 1 2/18  Dose adjustments this admission:   Microbiology results: Resp 2/19- normal flora Bcx 2/18: ngtd  Thank you for allowing pharmacy to be a part of this patient's care.  3/18, PharmD Clinical Pharmacist **Pharmacist phone directory can now be found on [email or web address] ([email or web address]).  Listed under [facility].

## 2019-07-16 NOTE — Plan of Care (Signed)
  Problem: Education: Goal: Knowledge of General Education information will improve Description: Including pain rating scale, medication(s)/side effects and non-pharmacologic comfort measures Outcome: Not Progressing   Problem: Health Behavior/Discharge Planning: Goal: Ability to manage health-related needs will improve Outcome: Not Progressing   Problem: Clinical Measurements: Goal: Ability to maintain clinical measurements within normal limits will improve Outcome: Progressing Goal: Will remain free from infection Outcome: Progressing Goal: Diagnostic test results will improve Outcome: Progressing Goal: Respiratory complications will improve Outcome: Progressing Goal: Cardiovascular complication will be avoided Outcome: Progressing   Problem: Activity: Goal: Risk for activity intolerance will decrease Outcome: Progressing   Problem: Nutrition: Goal: Adequate nutrition will be maintained Outcome: Progressing   Problem: Coping: Goal: Level of anxiety will decrease Outcome: Progressing   Problem: Elimination: Goal: Will not experience complications related to bowel motility Outcome: Progressing Goal: Will not experience complications related to urinary retention Outcome: Progressing   Problem: Pain Managment: Goal: General experience of comfort will improve Outcome: Progressing   Problem: Safety: Goal: Ability to remain free from injury will improve Outcome: Progressing   Problem: Skin Integrity: Goal: Risk for impaired skin integrity will decrease Outcome: Not Progressing

## 2019-07-16 NOTE — Progress Notes (Signed)
PULMONARY / CRITICAL CARE MEDICINE   NAME:  Karina Sloan, MRN:  [id number], DOB:  04/02/1941, LOS: 4 ADMISSION DATE:  06/27/2019, CONSULTATION DATE: 07/14/2019 REFERRING MD: Emergency department physician, CHIEF COMPLAINT: Status post bradycardic event  BRIEF HISTORY:    79 year old female found pulseless at SNF and had short CPR by EMS. Unresponsive in ED and 36C TTM started.   HISTORY OF PRESENT ILLNESS   79 year old female with a plethora of health issues he was transferred from [facility] to a skilled nursing facility in [facility].  She is a resident of [place].  COVID-19 diagnosed 06/15/19.  She also suffers from diabetes with vascular insufficiency of the lower extremities requiring surgical interventions.  Reported to have dementia.  Reported to have hypertension.  She also suffers from anemia, delirium, bilateral carotid stenosis, acute and chronic lower GI bleeding, congestive heart failure, acute osteomyelitis of left foot.  She has AV malformation. She was found to in the skilled nursing facility on med pass by the nurse to be unresponsive.  She noted no radial pulse. EMS was activated and noted no pulse, started CPR on their arrival. She is placed on epi drip transcutaneous pacing airway protection and transported to [facility] emergency department, reportedly she has been intubated and she is now off epinephrine and transcutaneous pacer at this time.  Reported to be a full code at this time.  Pulmonary critical care asked to admit.  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 Left ventricular ejection fraction, by estimation, is 60 to 65%. The  left ventricle has normal function. The left  ventricle has no regional  wall motion abnormalities. There is mild concentric left ventricular  hypertrophy. Left ventricular diastolic  parameters are indeterminate.  2. Right ventricular systolic function is normal. The right ventricular  size is normal. Tricuspid regurgitation signal is inadequate for assessing  PA pressure.  3. The mitral valve is grossly normal. Trivial mitral valve  regurgitation. No evidence of mitral stenosis.  4. The aortic valve is tricuspid. Aortic valve regurgitation is not  visualized. No aortic stenosis is present.  5. There is mild (Grade II) layered plaque involving the aortic root.   CULTURES:  COVID 1/22 and 2/18 positive Blood 2/18 > Urine 2/18 > GNR; sens pending Sputum 2/18 > reincubated  ANTIBIOTICS:  Cefepime 2/18 > Vanco 2/18 >  LINES/TUBES:  06/27/2019 endotracheal tube >  CONSULTANTS:    SUBJECTIVE:  No acute events o/n. EEG ongoing but appears to demonstrate diffuse slowing consistent with encephalopathy   CONSTITUTIONAL: BP (!) 171/53   Pulse (!) 53   Temp 98.6 F (37 C) (Bladder)   Resp 14   Ht 5\' 8"  (1.727 m)   Wt 68.4 kg   SpO2 100%   BMI 22.94 kg/m   I/O last 3 completed shifts: In: 3919.6 [I.V.:1669.3; Blood:630; NG/GT:1170; IV Piggyback:450.3] Out: 3125 [Urine:2975; Other:150]     Vent Mode: SIMV/PC/PS FiO2 (%):  [30 %] 30 % Set Rate:  [16 bmp] 16 bmp PEEP:  [5 cmH20] 5 cmH20 Pressure Support:  [5 cmH20-10 cmH20] 10 cmH20 Plateau Pressure:  [9 cmH20-14 cmH20] 9 cmH20  PHYSICAL EXAM: General:  Frail elderly female on vent Neuro:  +cough/gag, but weak. Weak corneal.  PERRL, pupils  3 mm sluggish. Deviant gaze. HEENT:  [place]/AT, EEG contacts in place.  Cardiovascular:  RRR, no r/m/g Lungs: CTAB Abdomen:  Soft, non-distended.  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 Several open wounds on both feet and heels as below.    R foot                                           R  heel    L foot                                  L heel     RESOLVED PROBLEM LIST  Septic shock AKI  ASSESSMENT AND PLAN    Critically ill due to acute hypoxic respiratory failure due to encephalopathy from cardiac arrest and possible pneumonia.  Now tolerating SBT - Daily SBT and extubate as mental status allows. - If no mental status recovery then options are tracheostomy or transition to comfort care  Cardiac arrest: Etiology unclear. Hyperkalemia? Primary arrhythmia with known history of 2nd degree block? Unknown downtime resuscitated by EMS to sinus bradycardia requiring epinephrine and TC pacing.  Both of which have now been stopped successfully. Echo normal - Discontinue TTM   Acute metabolic encephalopathy due to anoxic injury over preexisting dementia. - PRN sedation only for RASS goal 0 to -1.  - EEG shows diffuse encephalopathy w/o sz focus - Neuro-checks  Anemia: Hgb has been trending down; 6.2 this a.m -will transfuse 2U PRBC, repeat H/H -no obvious active site of bleeding, ie. No blood in feces, urine, OG tube; dressings dry.  --overall outlook for survival is not good, but if continues can consider further investigation  Multiple wounds: dorsal surface of both feet and bilateral heels. Small sacral decub. All present on admission. Pictured above. None are obviously infected.  - WOC consulted, reccs noted and being followed.  COVID 19: diagnosed 1/22.  Repeat 2/18 POSITIVE  Daily Goals Checklist  Pain/Anxiety/Delirium protocol (if indicated): on no sedation VAP protocol (if indicated): bundle in place Respiratory support goals: SBT daily Blood pressure target: Allow autoregulation.  DVT prophylaxis: UFH tid Nutrition Status: severe malnutrition Nutrition Problem: Inadequate oral intake Etiology: inability to eat Signs/Symptoms: NPO status Interventions: MVI, Prostat, Tube feeding GI prophylaxis: PPI Fluid status goals: allow autoregulation Urinary  catheter: convert to external catheter Central lines: PIV only. Glucose control: phase 1 glycemic protocol. Mobility/therapy needs: Bedrest Antibiotic de-escalation: Complete 7 days of antibiotics. Home medication reconciliation: on hold for now. Daily labs: CBC, BMP Code Status: DNR Family Communication: will update family Disposition: ICU   LABS  Glucose Recent Labs  Lab 07/15/19 1213 07/15/19 1606 07/15/19 2045 07/15/19 2348 07/16/19 0343 07/16/19 0820  GLUCAP 171* 136* 151* 165* 149* 168*    BMET Recent Labs  Lab 07/14/19 0528 07/15/19 0816 07/16/19 0200  NA 136 140 142  K 4.3 3.7 3.2*  CL 106 112* 113*  CO2 17* 19* 19*  BUN 35* 42* 38*  CREATININE 1.16* 0.86 0.79  GLUCOSE 156* 158* 167*    Liver Enzymes Recent Labs  Lab 07/18/2019 1015 07/15/19 0816 07/16/19 0200  AST 25 19 26   ALT 8 11 16   ALKPHOS 181* 170* 207*  BILITOT 1.2 0.3 <0.1*  ALBUMIN 1.8* 1.2* 1.3*    Electrolytes Recent Labs  Lab 07/13/19  1275 07/13/19 0517 07/13/19 1621 07/14/19 0528 07/15/19 0816 07/16/19 0200  CALCIUM 8.6*   < >  --  8.1* 7.9* 8.2*  MG 2.0  --  2.0 1.9  --   --   PHOS 3.9  --  3.4 2.6  --   --    < > = values in this interval not displayed.    CBC Recent Labs  Lab 07/14/19 0528 07/14/19 0528 07/15/19 0816 07/15/19 1326 07/16/19 0200  WBC 23.4*  --  21.7*  --  18.8*  HGB 7.0*   < > 6.2* 8.2* 9.2*  HCT 23.8*   < > 21.1* 26.2* 29.0*  PLT 494*  --  411*  --  385   < > = values in this interval not displayed.    ABG Recent Labs  Lab Aug 01, 2019 1121 01-Aug-2019 2226 07/13/19 0143  PHART 7.282* 7.439 7.408  PCO2ART 51.1* 26.4* 29.5*  PO2ART 523.0* 124.0* 106.0    Coag's Recent Labs  Lab 08/01/2019 1855 07/13/19 0517  APTT 53* 30  INR 1.0 1.1    Sepsis Markers Recent Labs  Lab 2019-08-01 1015  LATICACIDVEN 1.6    CRITICAL CARE Performed by: Lynnell Catalan   Total critical care time: 40 minutes  Critical care time was exclusive of  separately billable procedures and treating other patients.  Critical care was necessary to treat or prevent imminent or life-threatening deterioration.  Critical care was time spent personally by me on the following activities: development of treatment plan with patient and/or surrogate as well as nursing, discussions with consultants, evaluation of patient's response to treatment, examination of patient, obtaining history from patient or surrogate, ordering and performing treatments and interventions, ordering and review of laboratory studies, ordering and review of radiographic studies, pulse oximetry, re-evaluation of patient's condition and participation in multidisciplinary rounds.  Lynnell Catalan, MD [facility] ICU Physician [facility] [place] [facility]  Pager: [phone] Mobile: [phone] After hours: [phone].   07/16/2019 8:38 AM

## 2019-07-17 LAB — CBC WITH DIFFERENTIAL/PLATELET
Abs Immature Granulocytes: 0.24 10*3/uL — ABNORMAL HIGH (ref 0.00–0.07)
Basophils Absolute: 0 10*3/uL (ref 0.0–0.1)
Basophils Relative: 0 %
Eosinophils Absolute: 0.1 10*3/uL (ref 0.0–0.5)
Eosinophils Relative: 1 %
HCT: 27.1 % — ABNORMAL LOW (ref 36.0–46.0)
Hemoglobin: 8.2 g/dL — ABNORMAL LOW (ref 12.0–15.0)
Immature Granulocytes: 2 %
Lymphocytes Relative: 6 %
Lymphs Abs: 0.8 10*3/uL (ref 0.7–4.0)
MCH: 24.7 pg — ABNORMAL LOW (ref 26.0–34.0)
MCHC: 30.3 g/dL (ref 30.0–36.0)
MCV: 81.6 fL (ref 80.0–100.0)
Monocytes Absolute: 1.2 10*3/uL — ABNORMAL HIGH (ref 0.1–1.0)
Monocytes Relative: 9 %
Neutro Abs: 10.8 10*3/uL — ABNORMAL HIGH (ref 1.7–7.7)
Neutrophils Relative %: 82 %
Platelets: 394 10*3/uL (ref 150–400)
RBC: 3.32 MIL/uL — ABNORMAL LOW (ref 3.87–5.11)
RDW: 19.9 % — ABNORMAL HIGH (ref 11.5–15.5)
WBC: 13.1 10*3/uL — ABNORMAL HIGH (ref 4.0–10.5)
nRBC: 0.4 % — ABNORMAL HIGH (ref 0.0–0.2)

## 2019-07-17 LAB — COMPREHENSIVE METABOLIC PANEL
ALT: 26 U/L (ref 0–44)
AST: 34 U/L (ref 15–41)
Albumin: 1.2 g/dL — ABNORMAL LOW (ref 3.5–5.0)
Alkaline Phosphatase: 230 U/L — ABNORMAL HIGH (ref 38–126)
Anion gap: 10 (ref 5–15)
BUN: 37 mg/dL — ABNORMAL HIGH (ref 8–23)
CO2: 20 mmol/L — ABNORMAL LOW (ref 22–32)
Calcium: 8.4 mg/dL — ABNORMAL LOW (ref 8.9–10.3)
Chloride: 114 mmol/L — ABNORMAL HIGH (ref 98–111)
Creatinine, Ser: 0.77 mg/dL (ref 0.44–1.00)
GFR calc Af Amer: >60 mL/min (ref >60)
GFR calc non Af Amer: >60 mL/min (ref >60)
Glucose, Bld: 169 mg/dL — ABNORMAL HIGH (ref 70–99)
Potassium: 4 mmol/L (ref 3.5–5.1)
Sodium: 144 mmol/L (ref 135–145)
Total Bilirubin: 0.4 mg/dL (ref 0.3–1.2)
Total Protein: 6.1 g/dL — ABNORMAL LOW (ref 6.5–8.1)

## 2019-07-17 LAB — GLUCOSE, CAPILLARY
Glucose-Capillary: 139 mg/dL — ABNORMAL HIGH (ref 70–99)
Glucose-Capillary: 149 mg/dL — ABNORMAL HIGH (ref 70–99)
Glucose-Capillary: 159 mg/dL — ABNORMAL HIGH (ref 70–99)
Glucose-Capillary: 163 mg/dL — ABNORMAL HIGH (ref 70–99)
Glucose-Capillary: 203 mg/dL — ABNORMAL HIGH (ref 70–99)
Glucose-Capillary: 208 mg/dL — ABNORMAL HIGH (ref 70–99)

## 2019-07-17 LAB — CULTURE, BLOOD (ROUTINE X 2)
Culture: NO GROWTH
Culture: NO GROWTH
Special Requests: ADEQUATE

## 2019-07-17 MED ORDER — VITAL AF 1.2 CAL PO LIQD
1000.0000 mL | ORAL | Status: DC
  Administered 2019-07-17 – 2019-07-23 (×6): 1000 mL
  Filled 2019-07-17: qty 1000

## 2019-07-17 NOTE — Progress Notes (Signed)
Nutrition Follow up  DOCUMENTATION CODES:   Not applicable  INTERVENTION:   Change tube feeding:  -Vital AF 1.2 @ 45 ml/hr (1080 ml) via Cortrak  -30 ml Prostat BID  Provides: 1496 kcals, 111 grams protein, 876 ml free water.   NUTRITION DIAGNOSIS:   Inadequate oral intake related to inability to eat as evidenced by NPO status.  Ongoing  GOAL:   Provide needs based on ASPEN/SCCM guidelines   Being addressed via TF  MONITOR:   Labs, I & O's, Vent status, Skin, TF tolerance, Weight trends, Diet advancement  REASON FOR ASSESSMENT:   Ventilator, Consult Enteral/tube feeding initiation and management  ASSESSMENT:   79 year old female with past medical history of Mobitz type II heart block, Covid-19 positive 06/15/19, DM2 with vascular insufficiency of lower extremities s/p debridement, dementia, HTN, AV malformation, CHF, bilateral carotid stenosis, acute osteomyelitis of left foot, history of acute and chronic lower GI bleeding found unresponsive at SNF by a nurse during med pass. CPR on EMS arrival, required pacing and Epi and admitted for cardiac arrest.   Pt discussed during ICU rounds and with RN.   Off TTM. Off pressors/sedatives. Unable to extubate from neuro perspective. Ongoing GOC being discussed. Plan for Cortrak placement today. Change tube feeding to better meet needs.   Admission weight: 74 kg  Current weight: 67 kg   Patient remains intubated on ventilator support MV: 8.8 L/min Temp (24hrs), Avg:98.6 F (37 C), Min:98 F (36.7 C), Max:99.3 F (37.4 C)   I/O: +5,718 ml since admit  UOP: 1,075 ml x 24 hrs    Drips: NS @ 50 ml/hr  Medications: SS novolog, MVI with minerals  Labs: CBG 139-202  Diet Order:   Diet Order    None      EDUCATION NEEDS:   No education needs have been identified at this time  Skin:  Skin Assessment: Skin Integrity Issues: Skin Integrity Issues:: Stage II, Other (Comment) Stage II: Buttocks Other: Amputation:L  big toe, all R toes  Last BM:  2/20  Height:   Ht Readings from Last 1 Encounters:  08/06/2019 5\' 8"  (1.727 m)    Weight:   Wt Readings from Last 1 Encounters:  07/17/19 67 kg    BMI:  Body mass index is 22.46 kg/m.  Estimated Nutritional Needs:   Kcal:  1375 kcal  Protein:  100-115 grams  Fluid:  >/= 1.3 L/day  07/19/19 RD, LDN Clinical Nutrition Pager listed in AMION

## 2019-07-17 NOTE — Progress Notes (Signed)
PULMONARY / CRITICAL CARE MEDICINE   NAME:  Karina Sloan, MRN:  [id number], DOB:  1941-04-06, LOS: 5 ADMISSION DATE:  07/07/2019, CONSULTATION DATE: 07/13/2019 REFERRING MD: Emergency department physician, CHIEF COMPLAINT: Status post bradycardic event  BRIEF HISTORY:    79 year old female found pulseless at SNF and had short CPR by EMS. Unresponsive in ED and 36C TTM started.   HISTORY OF PRESENT ILLNESS   79 year old female with a plethora of health issues he was transferred from [facility] to a skilled nursing facility in [facility].  She is a resident of [place].  COVID-19 diagnosed 06/15/19.  She also suffers from diabetes with vascular insufficiency of the lower extremities requiring surgical interventions.  Reported to have dementia.  Reported to have hypertension.  She also suffers from anemia, delirium, bilateral carotid stenosis, acute and chronic lower GI bleeding, congestive heart failure, acute osteomyelitis of left foot.  She has AV malformation. She was found to in the skilled nursing facility on med pass by the nurse to be unresponsive.  She noted no radial pulse. EMS was activated and noted no pulse, started CPR on their arrival. She is placed on epi drip transcutaneous pacing airway protection and transported to [facility] emergency department, reportedly she has been intubated and she is now off epinephrine and transcutaneous pacer at this time.  Reported to be a full code at this time.  Pulmonary critical care asked to admit.  SIGNIFICANT PAST MEDICAL HISTORY   Mobitz type II heart block, Diabetes, Dementia, Vascular insufficiency secondary to diabetes History of COVID-19 06/15/2019  SIGNIFICANT EVENTS:  07/21/2019 admitted to [facility]  STUDIES:   CT head 2/18 > nonacute EEG 2/18 >Severe encephalopathy. No Sz seen.  TTE 2/18: Left ventricular ejection fraction, by estimation, is 60 to 65%. The  left ventricle has normal  function.Right ventricular systolic function is normal.   CULTURES:  COVID 1/22 and 2/18 positive Blood 2/18 >  Urine 2/18 > No predominant species Sputum 2/18 > reincubated  ANTIBIOTICS:  Cefepime 2/18 > Vanco 2/18 >  LINES/TUBES:  07/08/2019 endotracheal tube >  CONSULTANTS:    SUBJECTIVE:  No acute events overnight.   CONSTITUTIONAL: BP (!) 169/60 (BP Location: Left Arm)   Pulse (!) 54   Temp 98 F (36.7 C) (Axillary)   Resp 16   Ht 5\' 8"  (1.727 m)   Wt 67 kg   SpO2 100%   BMI 22.46 kg/m   I/O last 3 completed shifts: In: 2833.4 [I.V.:1522.2; NG/GT:1125; IV Piggyback:186.2] Out: 2200 [Urine:2100; Other:100]     Vent Mode: PSV;CPAP FiO2 (%):  [30 %] 30 % Set Rate:  [16 bmp] 16 bmp PEEP:  [5 cmH20] 5 cmH20 Pressure Support:  [5 cmH20] 5 cmH20 Plateau Pressure:  [9 cmH20-14 cmH20] 9 cmH20  PHYSICAL EXAM: General:  Frail elderly female Neuro:  Coma. Moves legs non-purposefully. Does not follow commands or open eyes to pain.  HEENT:  [place]/AT, PERRL, no JVD Cardiovascular:  RRR, no r/m/g Lungs: CTAB Abdomen:  Soft, non-distended.  Musculoskeletal/Skin:  Foot/toe amputations as pictured below with open wounds.    R foot                                           R heel    L foot  L heel     RESOLVED PROBLEM LIST  Septic shock AKI  ASSESSMENT AND PLAN    Critically ill due to acute hypoxic respiratory failure due to encephalopathy from cardiac arrest and possible pneumonia.  Now tolerating SBT - Daily SBT. Would not tolerate extubation from a neuro perspective.  - If no mental status recovery then options are tracheostomy or transition to comfort care  Cardiac arrest: Etiology unclear. Hyperkalemia? Primary arrhythmia with known history of 2nd degree block? Unknown downtime resuscitated by EMS to sinus bradycardia requiring epinephrine and TC pacing.  Both of which have now been stopped successfully. Echo normal - s/p  TTM. Rewarmed to normothermia 2/20, pads off 2/22.  Acute metabolic encephalopathy due to anoxic injury over preexisting dementia. - PRN sedation only for RASS goal 0 to -1.  - EEG shows diffuse encephalopathy w/o sz focus - Sedative administration has been minimal.  - Neuro-checks  Anemia: Hgb has been trending down. 8.2 after 2 units PRBC yesterday.  - Follow CBC - no obvious active site of bleeding, ie. No blood in feces, urine, OG tube; dressings dry.  - overall outlook for survival is not good, but if continues can consider further investigation  Multiple wounds: dorsal surface of both feet and bilateral heels. Small sacral decub. All present on admission. Pictured above. None are obviously infected.  - WOC consulted, reccs noted and being followed.  COVID 19: diagnosed 1/22.  Repeat 2/18 POSITIVE  Daily Goals Checklist  Pain/Anxiety/Delirium protocol (if indicated): on no sedation VAP protocol (if indicated): bundle in place Respiratory support goals: SBT daily Blood pressure target: Allow autoregulation.  DVT prophylaxis: UFH tid Nutrition Status: severe malnutrition Nutrition Problem: Inadequate oral intake Etiology: inability to eat Signs/Symptoms: NPO status Interventions: MVI, Prostat, Tube feeding GI prophylaxis: PPI Fluid status goals: allow autoregulation Urinary catheter: convert to external catheter Central lines: PIV only. Glucose control: phase 1 glycemic protocol. Mobility/therapy needs: Bedrest Antibiotic de-escalation: Complete 7 days of antibiotics. Home medication reconciliation: on hold for now. Daily labs: CBC, BMP Code Status: DNR Family Communication: will update family Disposition: ICU   LABS  Glucose Recent Labs  Lab 07/16/19 1116 07/16/19 1518 07/16/19 1929 07/16/19 2321 07/17/19 0332 07/17/19 0735  GLUCAP 167* 202* 164* 184* 139* 163*    BMET Recent Labs  Lab 07/15/19 0816 07/16/19 0200 07/17/19 0251  NA 140 142 144  K 3.7  3.2* 4.0  CL 112* 113* 114*  CO2 19* 19* 20*  BUN 42* 38* 37*  CREATININE 0.86 0.79 0.77  GLUCOSE 158* 167* 169*    Liver Enzymes Recent Labs  Lab 07/15/19 0816 07/16/19 0200 07/17/19 0251  AST 19 26 34  ALT 11 16 26   ALKPHOS 170* 207* 230*  BILITOT 0.3 <0.1* 0.4  ALBUMIN 1.2* 1.3* 1.2*    Electrolytes Recent Labs  Lab 07/13/19 0517 07/13/19 0517 07/13/19 1621 07/14/19 0528 07/14/19 0528 07/15/19 0816 07/16/19 0200 07/17/19 0251  CALCIUM 8.6*   < >  --  8.1*   < > 7.9* 8.2* 8.4*  MG 2.0  --  2.0 1.9  --   --   --   --   PHOS 3.9  --  3.4 2.6  --   --   --   --    < > = values in this interval not displayed.    CBC Recent Labs  Lab 07/15/19 0816 07/15/19 0816 07/15/19 1326 07/16/19 0200 07/17/19 0251  WBC 21.7*  --   --  18.8* 13.1*  HGB 6.2*   < > 8.2* 9.2* 8.2*  HCT 21.1*   < > 26.2* 29.0* 27.1*  PLT 411*  --   --  385 394   < > = values in this interval not displayed.    ABG Recent Labs  Lab 08/21/2019 1121 Jul 24, 2019 2226 07/13/19 0143  PHART 7.282* 7.439 7.408  PCO2ART 51.1* 26.4* 29.5*  PO2ART 523.0* 124.0* 106.0    Coag's Recent Labs  Lab 08/11/2019 1855 07/13/19 0517  APTT 53* 30  INR 1.0 1.1    Sepsis Markers Recent Labs  Lab 07/28/2019 1015  LATICACIDVEN 1.6    CRITICAL CARE Performed by: Corey Harold   Total critical care time: 35 minutes  Critical care time was exclusive of separately billable procedures and treating other patients.  Critical care was necessary to treat or prevent imminent or life-threatening deterioration.  Critical care was time spent personally by me on the following activities: Acute respitaroy failure, cardiac arrest, development of treatment plan with patient and/or surrogate as well as nursing, discussions with consultants, evaluation of patient's response to treatment, examination of patient, obtaining history from patient or surrogate, ordering and performing treatments and interventions, ordering  and review of laboratory studies, ordering and review of radiographic studies, pulse oximetry and re-evaluation of patient's condition.   Georgann Housekeeper, AGACNP-BC [place]  See Amion for personal pager PCCM on call pager [phone]  07/17/2019 9:04 AM

## 2019-07-17 NOTE — Procedures (Signed)
Cortrak  Person Inserting Tube:  Skie Vitrano, Verdon Cummins, RD Tube Type:  Cortrak - 43 inches Tube Location:  Left nare Initial Placement:  Stomach Secured by: Bridle Technique Used to Measure Tube Placement:  Documented cm marking at nare/ corner of mouth Cortrak Secured At:  75 cm    Eugene Gavia, MS, RD, LDN RD pager number and weekend/on-call pager number located in [place].

## 2019-07-18 LAB — CBC
HCT: 22.8 % — ABNORMAL LOW (ref 36.0–46.0)
Hemoglobin: 7 g/dL — ABNORMAL LOW (ref 12.0–15.0)
MCH: 24.8 pg — ABNORMAL LOW (ref 26.0–34.0)
MCHC: 30.7 g/dL (ref 30.0–36.0)
MCV: 80.9 fL (ref 80.0–100.0)
Platelets: 366 10*3/uL (ref 150–400)
RBC: 2.82 MIL/uL — ABNORMAL LOW (ref 3.87–5.11)
RDW: 20.5 % — ABNORMAL HIGH (ref 11.5–15.5)
WBC: 12.9 10*3/uL — ABNORMAL HIGH (ref 4.0–10.5)
nRBC: 0.2 % (ref 0.0–0.2)

## 2019-07-18 LAB — MAGNESIUM: Magnesium: 1.9 mg/dL (ref 1.7–2.4)

## 2019-07-18 LAB — BASIC METABOLIC PANEL
Anion gap: 9 (ref 5–15)
BUN: 39 mg/dL — ABNORMAL HIGH (ref 8–23)
CO2: 21 mmol/L — ABNORMAL LOW (ref 22–32)
Calcium: 8.4 mg/dL — ABNORMAL LOW (ref 8.9–10.3)
Chloride: 118 mmol/L — ABNORMAL HIGH (ref 98–111)
Creatinine, Ser: 0.78 mg/dL (ref 0.44–1.00)
GFR calc Af Amer: >60 mL/min (ref >60)
GFR calc non Af Amer: >60 mL/min (ref >60)
Glucose, Bld: 189 mg/dL — ABNORMAL HIGH (ref 70–99)
Potassium: 3.6 mmol/L (ref 3.5–5.1)
Sodium: 148 mmol/L — ABNORMAL HIGH (ref 135–145)

## 2019-07-18 LAB — GLUCOSE, CAPILLARY
Glucose-Capillary: 158 mg/dL — ABNORMAL HIGH (ref 70–99)
Glucose-Capillary: 113 mg/dL — ABNORMAL HIGH (ref 70–99)
Glucose-Capillary: 156 mg/dL — ABNORMAL HIGH (ref 70–99)
Glucose-Capillary: 164 mg/dL — ABNORMAL HIGH (ref 70–99)
Glucose-Capillary: 170 mg/dL — ABNORMAL HIGH (ref 70–99)

## 2019-07-18 MED ORDER — ACETAMINOPHEN 160 MG/5ML PO SOLN
650.0000 mg | Freq: Four times a day (QID) | ORAL | Status: DC | PRN
  Administered 2019-07-18 – 2019-07-23 (×2): 650 mg
  Filled 2019-07-18 (×2): qty 20.3

## 2019-07-18 NOTE — Plan of Care (Signed)
  Problem: Nutrition: Goal: Adequate nutrition will be maintained Outcome: Progressing   Problem: Coping: Goal: Level of anxiety will decrease Outcome: Progressing   Problem: Elimination: Goal: Will not experience complications related to urinary retention Outcome: Progressing   Problem: Role Relationship: Goal: Method of communication will improve Outcome: Not Progressing

## 2019-07-18 NOTE — Progress Notes (Addendum)
Only patient belonging at bedside found brought up with patient is upper denture. Confirmed with RN Luther Parody from [facility] that pt had no other belongings at bedside or in room prior to transport.

## 2019-07-18 NOTE — Progress Notes (Signed)
Labs show a decrease in hemoglobin from 8.2 to 7.0. RN notified RN at [facility]

## 2019-07-18 NOTE — Progress Notes (Signed)
PULMONARY / CRITICAL CARE MEDICINE   NAME:  Karina Sloan, MRN:  [id number], DOB:  Jan 29, 1941, LOS: 6 ADMISSION DATE:  07/15/2019, CONSULTATION DATE: 07/16/2019 REFERRING MD: Emergency department physician, CHIEF COMPLAINT: Status post bradycardic event  BRIEF HISTORY:    79 year old female found pulseless at SNF and had short CPR by EMS. Unresponsive in ED and 36C TTM started.   HISTORY OF PRESENT ILLNESS   79 year old female with a plethora of health issues he was transferred from [facility] to a skilled nursing facility in [facility].  She is a resident of [place].  COVID-19 diagnosed 06/15/19.  She also suffers from diabetes with vascular insufficiency of the lower extremities requiring surgical interventions.  Reported to have dementia.  Reported to have hypertension.  She also suffers from anemia, delirium, bilateral carotid stenosis, acute and chronic lower GI bleeding, congestive heart failure, acute osteomyelitis of left foot.  She has AV malformation. She was found to in the skilled nursing facility on med pass by the nurse to be unresponsive.  She noted no radial pulse. EMS was activated and noted no pulse, started CPR on their arrival. She is placed on epi drip transcutaneous pacing airway protection and transported to [facility] emergency department, reportedly she has been intubated and she is now off epinephrine and transcutaneous pacer at this time.  Reported to be a full code at this time.  Pulmonary critical care asked to admit.  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 No Sz seen.  TTE 2/18: Left ventricular ejection fraction, by estimation, is 60 to 65%. The  left ventricle has normal  function.Right ventricular systolic function is normal.   CULTURES:  COVID 1/22 and 2/18 positive Blood 2/18 >  Urine 2/18 > No predominant species Sputum 2/18 > reincubated  ANTIBIOTICS:  Cefepime 2/18 > Vanco 2/18 >  LINES/TUBES:  07/10/2019 endotracheal tube >  CONSULTANTS:    SUBJECTIVE:  No acute events overnight.  No change in mental status.  CONSTITUTIONAL: BP (!) 147/80   Pulse (!) 51   Temp 99.4 F (37.4 C) (Oral)   Resp (!) 21   Ht 5\' 8"  (1.727 m)   Wt 69.7 kg   SpO2 100%   BMI 23.36 kg/m   I/O last 3 completed shifts: In: 3411.9 [I.V.:1848.2; NG/GT:1563.8] Out: 1200 [Urine:1200]     Vent Mode: PSV;CPAP FiO2 (%):  [30 %-40 %] 40 % Set Rate:  [30 bmp] 30 bmp PEEP:  [5 cmH20] 5 cmH20 Pressure Support:  [5 cmH20] 5 cmH20  PHYSICAL EXAM: General:  Frail elderly female Neuro:  Coma. Moves legs non-purposefully. Does not follow commands or open eyes to pain.  HEENT:  [place]/AT, PERRL, no JVD Cardiovascular:  RRR, no r/m/g Lungs: CTAB Abdomen:  Soft, non-distended.  Musculoskeletal/Skin:  Foot/toe amputations as pictured below with open wounds.    R foot                                           R heel    L foot  L heel     RESOLVED PROBLEM LIST  Septic shock AKI  ASSESSMENT AND PLAN    Critically ill due to acute hypoxic respiratory failure due to encephalopathy from cardiac arrest and possible pneumonia.  Now tolerating SBT - Daily SBT. Would not tolerate extubation from a neuro perspective.  - If no mental status recovery then options are tracheostomy or transition to comfort care  Cardiac arrest: Etiology unclear. Hyperkalemia? Primary arrhythmia with known history of 2nd degree block? Unknown downtime resuscitated by EMS to sinus bradycardia requiring epinephrine and TC pacing.  Both of which have now been stopped successfully. Echo normal - s/p TTM. Rewarmed to normothermia 2/20, pads off 2/22. - Minimal  recovery to this point portends a poor prognosis and suggests patient will remain in a state similar to this long-term.  Acute metabolic encephalopathy due to anoxic injury over preexisting dementia. - PRN sedation only for RASS goal 0 to -1.  - EEG shows diffuse encephalopathy w/o sz focus - Sedative administration has been minimal.  - Neuro-checks  Anemia: Hgb has been trending down. 8.2 after 2 units PRBC yesterday.  - Follow CBC - no obvious active site of bleeding, ie. No blood in feces, urine, OG tube; dressings dry.  - overall outlook for survival is not good, but if continues can consider further investigation  Multiple wounds: dorsal surface of both feet and bilateral heels. Small sacral decub. All present on admission. Pictured above. None are obviously infected.  - WOC consulted, reccs noted and being followed.  COVID 19: diagnosed 1/22.  Repeat 2/18 POSITIVE  Daily Goals Checklist  Pain/Anxiety/Delirium protocol (if indicated): on no sedation VAP protocol (if indicated): bundle in place Respiratory support goals: SBT daily Blood pressure target: Allow autoregulation.  DVT prophylaxis: UFH tid Nutrition Status: severe malnutrition Nutrition Problem: Inadequate oral intake Etiology: inability to eat Signs/Symptoms: NPO status Interventions: MVI, Prostat, Tube feeding GI prophylaxis: PPI Fluid status goals: allow autoregulation Urinary catheter: convert to external catheter Central lines: PIV only. Glucose control: phase 1 glycemic protocol. Mobility/therapy needs: Bedrest Antibiotic de-escalation: Complete 7 days of antibiotics. Home medication reconciliation: on hold for now. Daily labs: CBC, BMP Code Status: DNR Family Communication: updated family 2/22.  Awaiting 7 day mark post arrest to finalize recovery.  Disposition: ICU. Plan to convene another meeting with family on 2/25.   LABS  Glucose Recent Labs  Lab 07/17/19 1527 07/17/19 1936 07/17/19 2345  07/18/19 0332 07/18/19 0744 07/18/19 1202  GLUCAP 149* 208* 203* 158* 113* 156*    BMET Recent Labs  Lab 07/16/19 0200 07/17/19 0251 07/18/19 0222  NA 142 144 148*  K 3.2* 4.0 3.6  CL 113* 114* 118*  CO2 19* 20* 21*  BUN 38* 37* 39*  CREATININE 0.79 0.77 0.78  GLUCOSE 167* 169* 189*    Liver Enzymes Recent Labs  Lab 07/15/19 0816 07/16/19 0200 07/17/19 0251  AST 19 26 34  ALT 11 16 26   ALKPHOS 170* 207* 230*  BILITOT 0.3 <0.1* 0.4  ALBUMIN 1.2* 1.3* 1.2*    Electrolytes Recent Labs  Lab 07/13/19 0517 07/13/19 0517 07/13/19 1621 07/14/19 0528 07/15/19 0816 07/16/19 0200 07/17/19 0251 07/18/19 0222  CALCIUM 8.6*   < >  --  8.1*   < > 8.2* 8.4* 8.4*  MG 2.0   < > 2.0 1.9  --   --   --  1.9  PHOS 3.9  --  3.4 2.6  --   --   --   --    < > =  values in this interval not displayed.    CBC Recent Labs  Lab 07/16/19 0200 07/17/19 0251 07/18/19 0222  WBC 18.8* 13.1* 12.9*  HGB 9.2* 8.2* 7.0*  HCT 29.0* 27.1* 22.8*  PLT 385 394 366    ABG Recent Labs  Lab 08/21/2019 1121 24-Jul-2019 2226 07/13/19 0143  PHART 7.282* 7.439 7.408  PCO2ART 51.1* 26.4* 29.5*  PO2ART 523.0* 124.0* 106.0    Coag's Recent Labs  Lab 08/13/2019 1855 07/13/19 0517  APTT 53* 30  INR 1.0 1.1    Sepsis Markers Recent Labs  Lab 08/02/2019 1015  LATICACIDVEN 1.6    CRITICAL CARE Performed by: Lynnell Catalan   Total critical care time: 35 minutes  Critical care time was exclusive of separately billable procedures and treating other patients.  Critical care was necessary to treat or prevent imminent or life-threatening deterioration.  Critical care was time spent personally by me on the following activities: Acute respitaroy failure, cardiac arrest, development of treatment plan with patient and/or surrogate as well as nursing, discussions with consultants, evaluation of patient's response to treatment, examination of patient, obtaining history from patient or surrogate,  ordering and performing treatments and interventions, ordering and review of laboratory studies, ordering and review of radiographic studies, pulse oximetry and re-evaluation of patient's condition.   Lynnell Catalan, MD [facility] ICU Physician [facility] Critical Care  Pager: [phone] Mobile: [phone] After hours: [phone].  07/18/2019, 2:51 PM       07/18/2019 2:48 PM

## 2019-07-18 NOTE — Progress Notes (Signed)
Pt transported on the ventilator from 2h18  To 4 N 18 without incident.

## 2019-07-19 DIAGNOSIS — G9341 Metabolic encephalopathy: Secondary | ICD-10-CM

## 2019-07-19 DIAGNOSIS — J9620 Acute and chronic respiratory failure, unspecified whether with hypoxia or hypercapnia: Secondary | ICD-10-CM

## 2019-07-19 DIAGNOSIS — E162 Hypoglycemia, unspecified: Secondary | ICD-10-CM

## 2019-07-19 LAB — GLUCOSE, CAPILLARY
Glucose-Capillary: 152 mg/dL — ABNORMAL HIGH (ref 70–99)
Glucose-Capillary: 188 mg/dL — ABNORMAL HIGH (ref 70–99)
Glucose-Capillary: 192 mg/dL — ABNORMAL HIGH (ref 70–99)
Glucose-Capillary: 173 mg/dL — ABNORMAL HIGH (ref 70–99)
Glucose-Capillary: 179 mg/dL — ABNORMAL HIGH (ref 70–99)
Glucose-Capillary: 188 mg/dL — ABNORMAL HIGH (ref 70–99)
Glucose-Capillary: 185 mg/dL — ABNORMAL HIGH (ref 70–99)

## 2019-07-19 NOTE — Progress Notes (Signed)
PULMONARY / CRITICAL CARE MEDICINE   NAME:  Karina Sloan, MRN:  [id number], DOB:  December 31, 1940, LOS: 7 ADMISSION DATE:  07/07/2019, CONSULTATION DATE: 06/30/2019 REFERRING MD: Emergency department physician, CHIEF COMPLAINT: Status post bradycardic event  BRIEF HISTORY:    79 year old female found pulseless at SNF and had short CPR by EMS. Unresponsive in ED and 36C TTM started.   HISTORY OF PRESENT ILLNESS   79 year old female with a plethora of health issues he was transferred from [facility] to a skilled nursing facility in [facility].  She is a resident of [place].  COVID-19 diagnosed 06/15/19.  She also suffers from diabetes with vascular insufficiency of the lower extremities requiring surgical interventions.  Reported to have dementia.  Reported to have hypertension.  She also suffers from anemia, delirium, bilateral carotid stenosis, acute and chronic lower GI bleeding, congestive heart failure, acute osteomyelitis of left foot.  She has AV malformation. She was found to in the skilled nursing facility on med pass by the nurse to be unresponsive.  She noted no radial pulse. EMS was activated and noted no pulse, started CPR on their arrival. She is placed on epi drip transcutaneous pacing airway protection and transported to [facility] emergency department, reportedly she has been intubated and she is now off epinephrine and transcutaneous pacer at this time.  Reported to be a full code at this time.  Pulmonary critical care asked to admit.  SIGNIFICANT PAST MEDICAL HISTORY   Mobitz type II heart block, Diabetes, Dementia, Vascular insufficiency secondary to diabetes History of COVID-19 06/15/2019  SIGNIFICANT EVENTS:  07/08/2019 admitted to [facility]:   CT head 2/18 > nonacute EEG 2/18 >Severe encephalopathy. No Sz seen.  TTE 2/18: Left ventricular ejection fraction, by estimation, is 60 to 65%. The  left ventricle has normal  function.Right ventricular systolic function is normal.   CULTURES:  COVID 1/22 and 2/18 positive Blood 2/18 >  Urine 2/18 > No predominant species Sputum 2/18 > reincubated  ANTIBIOTICS:  Cefepime 2/18 > 2/24 Vanco 2/18 > 2/24  LINES/TUBES:  06/27/2019 endotracheal tube >  CONSULTANTS:    SUBJECTIVE:  No sedation given since single dose of fentanyl on 2/23 No change in her neurological status reported Tolerates pressure support ventilation but does not have the mental status to protect airway Some green secretions reported especially when she receives mouth care   CONSTITUTIONAL: BP (!) 161/61 (BP Location: Left Arm)   Pulse (!) 59   Temp 98.4 F (36.9 C) (Axillary)   Resp 16   Ht 5\' 8"  (1.727 m)   Wt 71 kg   SpO2 100%   BMI 23.80 kg/m   I/O last 3 completed shifts: In: 3418.5 [I.V.:1798.5; NG/GT:1620] Out: 1350 [Urine:1350]     Vent Mode: PSV;CPAP FiO2 (%):  [30 %-40 %] 40 % PEEP:  [5 cmH20] 5 cmH20 Pressure Support:  [5 cmH20] 5 cmH20 Plateau Pressure:  [13 cmH20-15 cmH20] 13 cmH20  PHYSICAL EXAM: General: Acute and chronically ill-appearing woman ventilated unresponsive Neuro: She does have spontaneous drive to breathe, grimace to painful stimuli, some flexor movement of her bilateral upper extremities.  Does not open eyes, does not follow commands HEENT: ET tube in place, oropharynx otherwise clear Cardiovascular: Regular, distant, no murmur Lungs: Clear bilaterally Abdomen: Soft, nondistended, hypoactive bowel sounds Musculoskeletal/Skin: Chronic lower extremity, foot wounds with foot and toe amputations.  Photos taken earlier in the hospitalization as below:    R foot  R heel    L foot                                  L heel     RESOLVED PROBLEM LIST  Septic shock AKI  ASSESSMENT AND PLAN    Critically ill due to acute hypoxic respiratory failure due to encephalopathy from cardiac arrest and  possible pneumonia.  -Tolerates PSV but does not have the mental status for a successful extubation and airway protection -Need to discuss goals with family, determine duration of this degree of support.  Based on prior discussions it appears clear that the patient would not want prolonged critical care support.  In absence of neurologic improvement over the last week, prognosis for meaningful recovery poor.  Suspect we need to move towards a compassionate withdrawal of care  Cardiac arrest: Etiology unclear. Hyperkalemia? Primary arrhythmia with known history of 2nd degree block? Unknown downtime resuscitated by EMS to sinus bradycardia requiring epinephrine and TC pacing.  Both of which have now been stopped successfully. Echo normal - s/p TTM. Rewarmed to normothermia 2/20, pads off 2/22. -Follow electrolytes, telemetry  Acute metabolic encephalopathy due to anoxic injury over preexisting dementia. -Minimize all sedation -Neurochecks -As above prognosis for meaningful neurological recovery poor due to both her acute injury and her underlying dementia.  Anemia: Hgb has been trending down. 8.2 after 2 units PRBC yesterday.  -Repeat CBC 2/25.  No obvious source of bleeding noted -Decision regarding transfusion depending on our overall goals of care, family discussion.  Multiple wounds: dorsal surface of both feet and bilateral heels. Small sacral decub. All present on admission. Pictured above. None are obviously infected.  -Appreciate WOC assistance  COVID 19: diagnosed 1/22.  Repeat 2/18 POSITIVE  Daily Goals Checklist  Pain/Anxiety/Delirium protocol (if indicated): on no sedation VAP protocol (if indicated): bundle in place Respiratory support goals: SBT daily Blood pressure target: Allow autoregulation.  DVT prophylaxis: UFH tid Nutrition Status: severe malnutrition Nutrition Problem: Inadequate oral intake Etiology: inability to eat Signs/Symptoms: NPO statusUrinary catheter:  convert to external catheter Interventions: MVI, Prostat, Tube feeding GI prophylaxis: PPI Fluid status goals: allow autoregulation  Central lines: PIV only. Glucose control: phase 1 glycemic protocol. Mobility/therapy needs: Bedrest Home medication reconciliation: on hold for now. Code Status: DNR Family Communication: I discussed the patient's status and prognosis by phone with her son [name] and daughter [name] on 2/25.  Explained that we have been following neurologically, now day 7 without any significant mental status improvement.  This unfortunately portends a poor prognosis for meaningful neurological recovery.  They voiced understanding.  It is clear that the patient would not want prolonged mechanical ventilation or ICU support without a chance to get back to her previous quality of life.  Based on this I have recommended withdrawal of care and compassionate extubation.  Her family is disappointed but does understand and agree.  They would like to visit before we withdraw care.  [name] is in [place] and is making arrangements to travel in the next few days.  We will maintain current level of support, not escalate, as we await the family's visit.  Plan to then pursue withdrawal of care. Disposition: ICU.    LABS  Glucose Recent Labs  Lab 07/18/19 0744 07/18/19 1202 07/18/19 1924 07/18/19 2302 07/19/19 0314 07/19/19 0736  GLUCAP 113* 156* 164* 170* 188* 192*    BMET Recent Labs  Lab 07/16/19 0200  07/17/19 0251 07/18/19 0222  NA 142 144 148*  K 3.2* 4.0 3.6  CL 113* 114* 118*  CO2 19* 20* 21*  BUN 38* 37* 39*  CREATININE 0.79 0.77 0.78  GLUCOSE 167* 169* 189*    Liver Enzymes Recent Labs  Lab 07/15/19 0816 07/16/19 0200 07/17/19 0251  AST 19 26 34  ALT 11 16 26   ALKPHOS 170* 207* 230*  BILITOT 0.3 <0.1* 0.4  ALBUMIN 1.2* 1.3* 1.2*    Electrolytes Recent Labs  Lab 07/13/19 0517 07/13/19 0517 07/13/19 1621 07/14/19 0528 07/15/19 0816  07/16/19 0200 07/17/19 0251 07/18/19 0222  CALCIUM 8.6*   < >  --  8.1*   < > 8.2* 8.4* 8.4*  MG 2.0   < > 2.0 1.9  --   --   --  1.9  PHOS 3.9  --  3.4 2.6  --   --   --   --    < > = values in this interval not displayed.    CBC Recent Labs  Lab 07/16/19 0200 07/17/19 0251 07/18/19 0222  WBC 18.8* 13.1* 12.9*  HGB 9.2* 8.2* 7.0*  HCT 29.0* 27.1* 22.8*  PLT 385 394 366    ABG Recent Labs  Lab 07/09/2019 1121 07/05/2019 2226 07/13/19 0143  PHART 7.282* 7.439 7.408  PCO2ART 51.1* 26.4* 29.5*  PO2ART 523.0* 124.0* 106.0    Coag's Recent Labs  Lab 07/10/2019 1855 07/13/19 0517  APTT 53* 30  INR 1.0 1.1    Sepsis Markers Recent Labs  Lab 07/03/2019 1015  LATICACIDVEN 1.6    CRITICAL CARE Performed by: 07/14/19   Total critical care time: 34 minutes  Critical care time was exclusive of separately billable procedures and treating other patients.  Critical care was necessary to treat or prevent imminent or life-threatening deterioration.  Critical care was time spent personally by me on the following activities: Acute respitaroy failure, cardiac arrest, development of treatment plan with patient and/or surrogate as well as nursing, discussions with consultants, evaluation of patient's response to treatment, examination of patient, obtaining history from patient or surrogate, ordering and performing treatments and interventions, ordering and review of laboratory studies, ordering and review of radiographic studies, pulse oximetry and re-evaluation of patient's condition.   Leslye Peer, MD, PhD 07/19/2019, 9:09 AM [facility] [phone] or if no answer [phone]

## 2019-07-20 LAB — GLUCOSE, CAPILLARY
Glucose-Capillary: 188 mg/dL — ABNORMAL HIGH (ref 70–99)
Glucose-Capillary: 217 mg/dL — ABNORMAL HIGH (ref 70–99)
Glucose-Capillary: 211 mg/dL — ABNORMAL HIGH (ref 70–99)
Glucose-Capillary: 177 mg/dL — ABNORMAL HIGH (ref 70–99)
Glucose-Capillary: 179 mg/dL — ABNORMAL HIGH (ref 70–99)
Glucose-Capillary: 198 mg/dL — ABNORMAL HIGH (ref 70–99)

## 2019-07-20 LAB — CBC
HCT: 21.6 % — ABNORMAL LOW (ref 36.0–46.0)
Hemoglobin: 6.5 g/dL — CL (ref 12.0–15.0)
MCH: 24.7 pg — ABNORMAL LOW (ref 26.0–34.0)
MCHC: 30.1 g/dL (ref 30.0–36.0)
MCV: 82.1 fL (ref 80.0–100.0)
Platelets: 399 10*3/uL (ref 150–400)
RBC: 2.63 MIL/uL — ABNORMAL LOW (ref 3.87–5.11)
RDW: 22 % — ABNORMAL HIGH (ref 11.5–15.5)
WBC: 15.4 10*3/uL — ABNORMAL HIGH (ref 4.0–10.5)
nRBC: 0.3 % — ABNORMAL HIGH (ref 0.0–0.2)

## 2019-07-20 NOTE — Progress Notes (Signed)
PULMONARY / CRITICAL CARE MEDICINE   NAME:  Karina Sloan, MRN:  [id number], DOB:  1940/05/27, LOS: 8 ADMISSION DATE:  07/20/2019, CONSULTATION DATE: 2019-07-20 REFERRING MD: Emergency department physician, CHIEF COMPLAINT: Status post bradycardic event  BRIEF HISTORY:    79 year old female found pulseless at SNF and had short CPR by EMS. Unresponsive in ED and 36C TTM started.   HISTORY OF PRESENT ILLNESS   79 year old female with a plethora of health issues he was transferred from [facility] to a skilled nursing facility in [facility].  She is a resident of [place].  COVID-19 diagnosed 06/15/19.  She also suffers from diabetes with vascular insufficiency of the lower extremities requiring surgical interventions.  Reported to have dementia.  Reported to have hypertension.  She also suffers from anemia, delirium, bilateral carotid stenosis, acute and chronic lower GI bleeding, congestive heart failure, acute osteomyelitis of left foot.  She has AV malformation. She was found to in the skilled nursing facility on med pass by the nurse to be unresponsive.  She noted no radial pulse. EMS was activated and noted no pulse, started CPR on their arrival. She is placed on epi drip transcutaneous pacing airway protection and transported to [facility] emergency department, reportedly she has been intubated and she is now off epinephrine and transcutaneous pacer at this time.  Reported to be a full code at this time.  Pulmonary critical care asked to admit.  SIGNIFICANT PAST MEDICAL HISTORY   Mobitz type II heart block, Diabetes, Dementia, Vascular insufficiency secondary to diabetes History of COVID-19 06/15/2019  SIGNIFICANT EVENTS:  2019/07/20 admitted to [facility]:   CT head 2/18 > nonacute EEG 2/18 >Severe encephalopathy. No Sz seen.  TTE 2/18: Left ventricular ejection fraction, by estimation, is 60 to 65%. The  left ventricle has normal  function.Right ventricular systolic function is normal.   CULTURES:  COVID 1/22 and 2/18 positive Blood 2/18 >  Urine 2/18 > No predominant species Sputum 2/18 > reincubated  ANTIBIOTICS:  Cefepime 2/18 > 2/24 Vanco 2/18 > 2/24  LINES/TUBES:  07/20/2019 endotracheal tube >  CONSULTANTS:    SUBJECTIVE:  No changes in her neurological status Tolerates pressure support   CONSTITUTIONAL: BP (!) 158/53   Pulse (!) 56   Temp 98.8 F (37.1 C) (Oral)   Resp 19   Ht 5\' 8"  (1.727 m)   Wt 71 kg   SpO2 100%   BMI 23.80 kg/m   I/O last 3 completed shifts: In: 3317.5 [I.V.:1742.5; NG/GT:1575] Out: 5366 [Urine:1050]     Vent Mode: PSV;CPAP FiO2 (%):  [40 %] 40 % PEEP:  [5 cmH20] 5 cmH20 Pressure Support:  [5 cmH20-8 cmH20] 8 cmH20 Plateau Pressure:  [12 cmH20-14 cmH20] 14 cmH20  PHYSICAL EXAM: General: Ill-appearing elderly woman in no distress, ventilated Neuro: Eyes are partly open this morning.  Does not track.  Some grimace with pain.  No flexion today.  Does not follow commands.  Spontaneous drive to breathe intact, corneals intact HEENT: ET tube in place, oropharynx otherwise clear Cardiovascular: Regular, distant, 60s, no murmur Lungs: Clear bilaterally Abdomen: Nondistended, hypoactive bowel sounds Musculoskeletal/Skin: Chronic lower extremity, foot wounds with foot and toe amputations.  Photos taken earlier in the hospitalization as below:    R foot  R heel    L foot                                  L heel     RESOLVED PROBLEM LIST  Septic shock AKI  ASSESSMENT AND PLAN    Critically ill due to acute hypoxic respiratory failure due to encephalopathy from cardiac arrest and possible pneumonia.  -Continue PSV ad lib.  Unfortunately does not have the mental status or airway protection to proceed with extubation -Goals of care discussed with family yesterday as detailed below.  I recommended compassionate  extubation and withdrawal of care.  They are planning to come to the hospital so that we can organize  Cardiac arrest: Etiology unclear. Hyperkalemia? Primary arrhythmia with known history of 2nd degree block? Unknown downtime resuscitated by EMS to sinus bradycardia requiring epinephrine and TC pacing.  Both of which have now been stopped successfully. Echo normal -TTM completed -Continue to electrolytes, telemetry  Acute metabolic encephalopathy due to anoxic injury over preexisting dementia. -Minimizing sedation -Neurochecks -Poor prognosis for meaningful neurological recovery poor due to both her acute injury and her underlying dementia.  Anemia: Hgb has been trending down. 8.2 after 2 units PRBC yesterday.  -CBC from this morning still pending.  No obvious source of bleeding noted -Conservative with transfusions especially given her overall goals of care  Multiple wounds: dorsal surface of both feet and bilateral heels. Small sacral decub. All present on admission. Pictured above. None are obviously infected.  -Appreciate WOC assistance  COVID 19: diagnosed 1/22.  Repeat 2/18 POSITIVE  Daily Goals Checklist  Pain/Anxiety/Delirium protocol (if indicated): on no sedation VAP protocol (if indicated): bundle in place Respiratory support goals: SBT daily Blood pressure target: Allow autoregulation.  DVT prophylaxis: UFH tid Nutrition Status: severe malnutrition Nutrition Problem: Inadequate oral intake Etiology: inability to eat Signs/Symptoms: NPO statusUrinary catheter: convert to external catheter Interventions: MVI, Prostat, Tube feeding GI prophylaxis: PPI Fluid status goals: allow autoregulation  Central lines: PIV only. Glucose control: phase 1 glycemic protocol. Mobility/therapy needs: Bedrest Home medication reconciliation: on hold for now. Code Status: DNR Family Communication: Dr Delton Coombes discussed the patient's status and prognosis by phone with her son [name] and  daughter [name] on 2/25.  Explained that we have been following neurologically 7+ days without any significant mental status improvement.  This unfortunately portends a poor prognosis for meaningful neurological recovery.  They voiced understanding.  They are clear that the patient would not want prolonged mechanical ventilation or ICU support without a chance to get back to her previous quality of life.  Based on this I have recommended withdrawal of care and compassionate extubation.  Her family is disappointed but does understand and agree.  They would like to visit before we withdraw care.  [name] is in [place] and is making arrangements to travel in the next few days.  We will maintain current level of support, not escalate, as we await the family's visit.  Plan to then pursue withdrawal of care. Disposition: ICU.    LABS  Glucose Recent Labs  Lab 07/19/19 0736 07/19/19 1126 07/19/19 1551 07/19/19 1915 07/19/19 2320 07/20/19 0408  GLUCAP 192* 173* 179* 188* 185* 188*    BMET Recent Labs  Lab 07/16/19 0200 07/17/19 0251 07/18/19 0222  NA 142 144 148*  K 3.2* 4.0 3.6  CL 113* 114* 118*  CO2 19* 20* 21*  BUN 38* 37* 39*  CREATININE 0.79 0.77 0.78  GLUCOSE 167* 169* 189*    Liver Enzymes Recent Labs  Lab 07/15/19 0816 07/16/19 0200 07/17/19 0251  AST 19 26 34  ALT 11 16 26   ALKPHOS 170* 207* 230*  BILITOT 0.3 <0.1* 0.4  ALBUMIN 1.2* 1.3* 1.2*    Electrolytes Recent Labs  Lab 07/13/19 1621 07/14/19 0528 07/15/19 0816 07/16/19 0200 07/17/19 0251 07/18/19 0222  CALCIUM  --  8.1*   < > 8.2* 8.4* 8.4*  MG 2.0 1.9  --   --   --  1.9  PHOS 3.4 2.6  --   --   --   --    < > = values in this interval not displayed.    CBC Recent Labs  Lab 07/16/19 0200 07/17/19 0251 07/18/19 0222  WBC 18.8* 13.1* 12.9*  HGB 9.2* 8.2* 7.0*  HCT 29.0* 27.1* 22.8*  PLT 385 394 366    ABG No results for input(s): PHART, PCO2ART, PO2ART in the last 168  hours.  Coag's No results for input(s): APTT, INR in the last 168 hours.  Sepsis Markers No results for input(s): LATICACIDVEN, PROCALCITON, O2SATVEN in the last 168 hours.  CRITICAL CARE Performed by: 07/20/19   Total critical care time: 31 minutes  Critical care time was exclusive of separately billable procedures and treating other patients.  Critical care was necessary to treat or prevent imminent or life-threatening deterioration.  Critical care was time spent personally by me on the following activities: Acute respitaroy failure, cardiac arrest, development of treatment plan with patient and/or surrogate as well as nursing, discussions with consultants, evaluation of patient's response to treatment, examination of patient, obtaining history from patient or surrogate, ordering and performing treatments and interventions, ordering and review of laboratory studies, ordering and review of radiographic studies, pulse oximetry and re-evaluation of patient's condition.   Leslye Peer, MD, PhD 07/20/2019, 8:24 AM [facility] [phone] or if no answer [phone]

## 2019-07-20 NOTE — Progress Notes (Signed)
CRITICAL VALUE ALERT  Critical Value:  Hemoglobin 6.5  Date & Time Notied:  07/20/19  Provider Notified: Dr. Delton Coombes  Orders Received/Actions taken: Spoke with Dr. Delton Coombes at bedside. Goals of care being discussed with family.

## 2019-07-20 NOTE — Progress Notes (Signed)
Chaplain provided prayer over Ms. Newhouse at request of family friend.  Chaplain checked in with nurse about Ms. Ahart family and any need for support.  Chaplain will let unit chaplain know about visit and have evening chaplain try to speak with Ms. Fedrick son for a check-in.

## 2019-07-20 NOTE — Progress Notes (Signed)
Spoke with Dr. Delton Coombes at bedside regarding no BM since 2/20.

## 2019-07-21 DIAGNOSIS — G931 Anoxic brain damage, not elsewhere classified: Secondary | ICD-10-CM

## 2019-07-21 DIAGNOSIS — Z9289 Personal history of other medical treatment: Secondary | ICD-10-CM | POA: Insufficient documentation

## 2019-07-21 LAB — GLUCOSE, CAPILLARY
Glucose-Capillary: 200 mg/dL — ABNORMAL HIGH (ref 70–99)
Glucose-Capillary: 180 mg/dL — ABNORMAL HIGH (ref 70–99)
Glucose-Capillary: 210 mg/dL — ABNORMAL HIGH (ref 70–99)
Glucose-Capillary: 196 mg/dL — ABNORMAL HIGH (ref 70–99)
Glucose-Capillary: 192 mg/dL — ABNORMAL HIGH (ref 70–99)
Glucose-Capillary: 194 mg/dL — ABNORMAL HIGH (ref 70–99)

## 2019-07-21 NOTE — Progress Notes (Signed)
PULMONARY / CRITICAL CARE MEDICINE   NAME:  Karina Sloan, MRN:  [id number], DOB:  [birth year], LOS: 9 ADMISSION DATE:  07/04/2019, CONSULTATION DATE: 07/03/2019 REFERRING MD: Emergency department physician, CHIEF COMPLAINT: Status post bradycardic event  BRIEF HISTORY:    79 year old female found pulseless at SNF and had short CPR by EMS. Unresponsive in ED and 36C TTM started.   HISTORY OF PRESENT ILLNESS   79 year old female with a plethora of health issues he was transferred from [facility] to a skilled nursing facility in [facility].  She is a resident of [place].  COVID-19 diagnosed 06/15/19.  She also suffers from diabetes with vascular insufficiency of the lower extremities requiring surgical interventions.  Reported to have dementia.  Reported to have hypertension.  She also suffers from anemia, delirium, bilateral carotid stenosis, acute and chronic lower GI bleeding, congestive heart failure, acute osteomyelitis of left foot.  She has AV malformation. She was found to in the skilled nursing facility on med pass by the nurse to be unresponsive.  She noted no radial pulse. EMS was activated and noted no pulse, started CPR on their arrival. She is placed on epi drip transcutaneous pacing airway protection and transported to [facility] emergency department, reportedly she has been intubated and she is now off epinephrine and transcutaneous pacer at this time.  Reported to be a full code at this time.  Pulmonary critical care asked to admit.  SIGNIFICANT PAST MEDICAL HISTORY   Mobitz type II heart block, Diabetes, Dementia, Vascular insufficiency secondary to diabetes History of COVID-19 06/15/2019  SIGNIFICANT EVENTS:  06/26/2019 admitted to [facility]  STUDIES:   CT head 2/18 > nonacute EEG 2/18 >Severe encephalopathy. No Sz seen.  TTE 2/18: Left ventricular ejection fraction, by estimation, is 60 to 65%. The  left ventricle has normal  function.Right ventricular systolic function is normal.   CULTURES:  COVID 1/22 and 2/18 positive Blood 2/18 > negative Urine 2/18 > No predominant species Sputum 2/18 > negative  ANTIBIOTICS:  Cefepime 2/18 > 2/24 Vanco 2/18 > 2/24  LINES/TUBES:  endotracheal tube 2/18 >  CONSULTANTS:  WOC  SUBJECTIVE:  Remains unresponsive on ventilator with no continuous sedation, RN reports no acute events overnight   CONSTITUTIONAL: BP (!) 174/45   Pulse (!) 58   Temp 98.2 F (36.8 C) (Axillary)   Resp 20   Ht 5\' 8"  (1.727 m)   Wt 70.6 kg   SpO2 100%   BMI 23.67 kg/m   I/O last 3 completed shifts: In: 3419.4 [I.V.:1799.4; NG/GT:1620] Out: 600 [Urine:600]     Vent Mode: PSV;CPAP FiO2 (%):  [40 %] 40 % PEEP:  [5 cmH20] 5 cmH20 Pressure Support:  [5 cmH20-8 cmH20] 8 cmH20  PHYSICAL EXAM: General: Chronically ill appearing elderly female on mechanical ventilation, in NAD HEENT: ETT, MM pink/moist, PERRL,  Neuro: Unresponsive, not currently on any sedating medications CV: s1s2 regular rate and rhythm, no murmur, rubs, or gallops,  PULM: Clear to auscultation bilaterally, no increased work of breathing, no added breath sounds GI: soft, bowel sounds active in all 4 quadrants, non-tender, non-distended, tolerating TF Extremities: warm/dry, no edema  Skin: There are photos taken earlier in the hospitalization as below:    R foot  R heel    L foot                                  L heel     RESOLVED PROBLEM LIST  Septic shock AKI  ASSESSMENT AND PLAN    Critically ill due to acute hypoxic respiratory failure due to encephalopathy from cardiac arrest and possible pneumonia.  P: Continue ventilator support with lung protective strategies  Unfortunately mentation remains very worried extubation, patient remains unresponsive Goals of care discussed with family, currently awaiting final decision Wean PEEP and FiO2 for sats  greater than 90% Remains on pressure support Head of bed elevated 30 degrees. Plateau pressures less than 30 cm H20.  Ensure adequate pulmonary hygiene   Cardiac arrest:  -Etiology unclear. Hyperkalemia? Primary arrhythmia with known history of 2nd degree block? Unknown downtime resuscitated by EMS to sinus bradycardia requiring epinephrine and TC pacing.  Echo normal P: Status post TTM Continuous telemetry Monitor electrolytes, replace as needed  Acute metabolic encephalopathy due to anoxic injury over preexisting dementia. P: Currently requiring no sedation Very poor prognosis for meaningful neurological recovery Goals of care actively being discussed  Anemia:  -Hgb has been trending down 2 units PRBC 2/21 P: CBC further down trended to 6.5 as of 2/26, will discuss goals of care prior to additional blood products  Multiple wounds:  -dorsal surface of both feet and bilateral heels. Small sacral decub. All present on admission. Pictured above. None are obviously infected.  P: WOC following Wound care Elevation  Number extremities COVID 19: diagnosed 1/22.  Repeat 2/18 POSITIVE  At risk malnutrition -Due to inadequate oral intake P: Continue tube feeds  Goals of care Discussed the patient's status and prognosis by phone with her son [name] and daughter [name] on 2/25.  Explained that we have been following neurologically 7+ days without any significant mental status improvement.  This unfortunately portends a poor prognosis for meaningful neurological recovery.  They voiced understanding.  They are clear that the patient would not want prolonged mechanical ventilation or ICU support without a chance to get back to her previous quality of life.  Based on this I have recommended withdrawal of care and compassionate extubation.  Her family is disappointed but does understand and agree.  They would like to visit before we withdraw care.  [name] is in [place] and is making  arrangements to travel in the next few days.  We will maintain current level of support, not escalate, as we await the family's visit.  Plan to then pursue withdrawal of care.  Daily Goals Checklist  Pain/Anxiety/Delirium protocol (if indicated): on no sedation VAP protocol (if indicated): bundle in place Respiratory support goals: SBT daily Blood pressure target: Allow autoregulation.  DVT prophylaxis: UFH tid Nutrition Status: severe malnutrition Nutrition Problem: Inadequate oral intake Etiology: inability to eat Signs/Symptoms: NPO statusUrinary catheter: convert to external catheter Interventions: MVI, Prostat, Tube feeding GI prophylaxis: PPI Fluid status goals: allow autoregulation  Central lines: PIV only. Glucose control: phase 1 glycemic protocol. Mobility/therapy needs: Bedrest Home medication reconciliation: on hold for now. Code Status: DNR Family Communication:Will update later today  Disposition: ICU   LABS  Glucose Recent Labs  Lab 07/20/19 1206 07/20/19 1525 07/20/19 1914 07/20/19 2318 07/21/19 0307 07/21/19 0726  GLUCAP 211* 177* 179* 198* 200* 180*    BMET Recent Labs  Lab 07/16/19 0200 07/17/19 0251 07/18/19 0222  NA 142  144 148*  K 3.2* 4.0 3.6  CL 113* 114* 118*  CO2 19* 20* 21*  BUN 38* 37* 39*  CREATININE 0.79 0.77 0.78  GLUCOSE 167* 169* 189*    Liver Enzymes Recent Labs  Lab 07/15/19 0816 07/16/19 0200 07/17/19 0251  AST 19 26 34  ALT 11 16 26   ALKPHOS 170* 207* 230*  BILITOT 0.3 <0.1* 0.4  ALBUMIN 1.2* 1.3* 1.2*    Electrolytes Recent Labs  Lab 07/16/19 0200 07/17/19 0251 07/18/19 0222  CALCIUM 8.2* 8.4* 8.4*  MG  --   --  1.9    CBC Recent Labs  Lab 07/17/19 0251 07/18/19 0222 07/20/19 0752  WBC 13.1* 12.9* 15.4*  HGB 8.2* 7.0* 6.5*  HCT 27.1* 22.8* 21.6*  PLT 394 366 399    ABG No results for input(s): PHART, PCO2ART, PO2ART in the last 168 hours.  Coag's No results for input(s): APTT, INR in the  last 168 hours.  Sepsis Markers No results for input(s): LATICACIDVEN, PROCALCITON, O2SATVEN in the last 168 hours.  CRITICAL CARE Performed by: Johnsie Cancel   Total critical care time: 38 minutes  Critical care time was exclusive of separately billable procedures and treating other patients.  Critical care was necessary to treat or prevent imminent or life-threatening deterioration.  Critical care was time spent personally by me on the following activities: Acute respitaroy failure, cardiac arrest, development of treatment plan with patient and/or surrogate as well as nursing, discussions with consultants, evaluation of patient's response to treatment, examination of patient, obtaining history from patient or surrogate, ordering and performing treatments and interventions, ordering and review of laboratory studies, ordering and review of radiographic studies, pulse oximetry and re-evaluation of patient's condition.  Johnsie Cancel, NP-C [facility] Contact / Pager information can be found on Amion  07/21/2019, 9:42 AM

## 2019-07-22 ENCOUNTER — Inpatient Hospital Stay (HOSPITAL_COMMUNITY): Payer: Medicare HMO

## 2019-07-22 DIAGNOSIS — Z9289 Personal history of other medical treatment: Secondary | ICD-10-CM

## 2019-07-22 DIAGNOSIS — J9602 Acute respiratory failure with hypercapnia: Secondary | ICD-10-CM

## 2019-07-22 DIAGNOSIS — A419 Sepsis, unspecified organism: Secondary | ICD-10-CM

## 2019-07-22 DIAGNOSIS — R6521 Severe sepsis with septic shock: Secondary | ICD-10-CM

## 2019-07-22 LAB — GLUCOSE, CAPILLARY
Glucose-Capillary: 187 mg/dL — ABNORMAL HIGH (ref 70–99)
Glucose-Capillary: 195 mg/dL — ABNORMAL HIGH (ref 70–99)
Glucose-Capillary: 198 mg/dL — ABNORMAL HIGH (ref 70–99)
Glucose-Capillary: 192 mg/dL — ABNORMAL HIGH (ref 70–99)
Glucose-Capillary: 210 mg/dL — ABNORMAL HIGH (ref 70–99)
Glucose-Capillary: 208 mg/dL — ABNORMAL HIGH (ref 70–99)

## 2019-07-22 LAB — COMPREHENSIVE METABOLIC PANEL
ALT: 29 U/L (ref 0–44)
AST: 18 U/L (ref 15–41)
Albumin: 1.2 g/dL — ABNORMAL LOW (ref 3.5–5.0)
Alkaline Phosphatase: 234 U/L — ABNORMAL HIGH (ref 38–126)
Anion gap: 8 (ref 5–15)
BUN: 44 mg/dL — ABNORMAL HIGH (ref 8–23)
CO2: 25 mmol/L (ref 22–32)
Calcium: 8.6 mg/dL — ABNORMAL LOW (ref 8.9–10.3)
Chloride: 124 mmol/L — ABNORMAL HIGH (ref 98–111)
Creatinine, Ser: 0.74 mg/dL (ref 0.44–1.00)
GFR calc Af Amer: >60 mL/min (ref >60)
GFR calc non Af Amer: >60 mL/min (ref >60)
Glucose, Bld: 216 mg/dL — ABNORMAL HIGH (ref 70–99)
Potassium: 4 mmol/L (ref 3.5–5.1)
Sodium: 157 mmol/L — ABNORMAL HIGH (ref 135–145)
Total Bilirubin: 0.6 mg/dL (ref 0.3–1.2)
Total Protein: 6.3 g/dL — ABNORMAL LOW (ref 6.5–8.1)

## 2019-07-22 LAB — CBC
HCT: 21.7 % — ABNORMAL LOW (ref 36.0–46.0)
Hemoglobin: 6.4 g/dL — CL (ref 12.0–15.0)
MCH: 24.6 pg — ABNORMAL LOW (ref 26.0–34.0)
MCHC: 29.5 g/dL — ABNORMAL LOW (ref 30.0–36.0)
MCV: 83.5 fL (ref 80.0–100.0)
Platelets: 413 10*3/uL — ABNORMAL HIGH (ref 150–400)
RBC: 2.6 MIL/uL — ABNORMAL LOW (ref 3.87–5.11)
RDW: 22.8 % — ABNORMAL HIGH (ref 11.5–15.5)
WBC: 15.7 10*3/uL — ABNORMAL HIGH (ref 4.0–10.5)
nRBC: 0.3 % — ABNORMAL HIGH (ref 0.0–0.2)

## 2019-07-22 NOTE — Progress Notes (Signed)
PULMONARY / CRITICAL CARE MEDICINE   NAME:  Karina Sloan, MRN:  [id number], DOB:  [birth year], LOS: 37 ADMISSION DATE:  2019/07/15, CONSULTATION DATE: 07-15-19 REFERRING MD: Emergency department physician, CHIEF COMPLAINT: Status post bradycardic event  BRIEF HISTORY:    79 year old female found pulseless at SNF and had short CPR by EMS. Unresponsive in ED and 36C TTM started.   HISTORY OF PRESENT ILLNESS   79 year old female with a plethora of health issues he was transferred from [facility] to a skilled nursing facility in [facility].  She is a resident of [place].  COVID-19 diagnosed 06/15/19.  She also suffers from diabetes with vascular insufficiency of the lower extremities requiring surgical interventions.  Reported to have dementia.  Reported to have hypertension.  She also suffers from anemia, delirium, bilateral carotid stenosis, acute and chronic lower GI bleeding, congestive heart failure, acute osteomyelitis of left foot.  She has AV malformation. She was found to in the skilled nursing facility on med pass by the nurse to be unresponsive.  She noted no radial pulse. EMS was activated and noted no pulse, started CPR on their arrival. She is placed on epi drip transcutaneous pacing airway protection and transported to [facility] emergency department, reportedly she has been intubated and she is now off epinephrine and transcutaneous pacer at this time.  Reported to be a full code at this time.  Pulmonary critical care asked to admit.  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 No Sz seen.  TTE 2/18: Left ventricular ejection fraction, by estimation, is 60 to 65%. The  left ventricle has normal  function.Right ventricular systolic function is normal.   CULTURES:  COVID 1/22 and 2/18 positive Blood 2/18 > negative Urine 2/18 > No predominant species Sputum 2/18 > negative  ANTIBIOTICS:  Cefepime 2/18 > 2/24 Vanco 2/18 > 2/24  LINES/TUBES:  endotracheal tube 2/18 >  CONSULTANTS:  WOC  SUBJECTIVE:  RN reports no acute events overnight, patient remains unresponsive on ventilator with no sedation   CONSTITUTIONAL: BP (!) 170/61   Pulse (!) 49   Temp 99.5 F (37.5 C) (Axillary)   Resp 19   Ht 5\' 8"  (1.727 m)   Wt 70.4 kg   SpO2 100%   BMI 23.60 kg/m   I/O last 3 completed shifts: In: 3321.7 [I.V.:1746.7; NG/GT:1575] Out: 400 [Urine:400]     Vent Mode: CPAP;PSV FiO2 (%):  [40 %] 40 % PEEP:  [5 cmH20] 5 cmH20 Pressure Support:  [8 cmH20] 8 cmH20 Plateau Pressure:  [11 XKG81-85 cmH20] 11 cmH20  PHYSICAL EXAM: General: Chronically ill appearing elderly very deconditioned female on mechanical ventilation, in NAD HEENT: ETT, MM pink/moist, PERRL,  Neuro: Unresponsive on ventilator, currently requiring no sedation CV: s1s2 regular rate and rhythm, no murmur, rubs, or gallops,  PULM: Clear to auscultation bilaterally, no increased work of breathing, no added breath sounds GI: soft, bowel sounds active in all 4 quadrants, non-tender, non-distended, tolerating TF Extremities: warm/dry, no edema  Skin: There are photos taken earlier in the hospitalization as below:    R foot  R heel    L foot                                  L heel     RESOLVED PROBLEM LIST  Septic shock AKI  ASSESSMENT AND PLAN    Critically ill due to acute hypoxic respiratory failure due to encephalopathy from cardiac arrest and possible pneumonia.  P: Attempted to coordinate with family to discuss goals of care 2/27 however family arrived after shift change.  Will reattempt communication today Patient remains unresponsive with no sedating  medications therefore mentation continues to remain barrier to extubation Remains on pressure support Head of bed elevated greater than 30 degrees  Cardiac arrest:  -Etiology unclear. Hyperkalemia? Primary arrhythmia with known history of 2nd degree block? Unknown downtime resuscitated by EMS to sinus bradycardia requiring epinephrine and TC pacing.  Echo normal P: Status post TTM Continuous telemetry Monitor electrolytes  Acute metabolic encephalopathy due to anoxic injury over preexisting dementia. P: Remains unresponsive with no sedation on board Very poor prognosis for meaningful neurological recovery Continue attempts at goals of care discussion with family  Anemia:  -Hgb has been trending down 2 units PRBC 2/21 P: Awaiting decision on timing of one-way extubation Given likely transition to comfort measures will hold on any further lab draws or blood transfusions at this time  Multiple wounds:  -dorsal surface of both feet and bilateral heels. Small sacral decub. All present on admission. Pictured above. None are obviously infected.  P: WOC following Wound care Pressure-point alleviation  COVID 19: diagnosed 1/22.  Repeat 2/18 POSITIVE  At risk malnutrition -Due to inadequate oral intake P: Continue tube feeds  Goals of care Communicated with family again 2/27 regarding patient's poor prognosis and no meaningful neurological recovery. Patient son Hillard Danker was able to visit patient yesterday evening and per nursing daughter is to visit today. Once family has visited with patient will discuss next steps regarding possible one-way extubation.  Daily Goals Checklist  Pain/Anxiety/Delirium protocol (if indicated): on no sedation VAP protocol (if indicated): bundle in place Respiratory support goals: SBT daily Blood pressure target: Allow autoregulation.  DVT prophylaxis: UFH tid Nutrition Status: severe malnutrition GI prophylaxis: PPI Fluid status goals: allow  autoregulation Code Status: DNR Family Communication:Will update later today  Disposition: ICU   LABS  Glucose Recent Labs  Lab 07/21/19 1108 07/21/19 1518 07/21/19 1923 07/21/19 2307 07/22/19 0332 07/22/19 0722  GLUCAP 210* 196* 192* 194* 187* 195*    BMET Recent Labs  Lab 07/16/19 0200 07/17/19 0251 07/18/19 0222  NA 142 144 148*  K 3.2* 4.0 3.6  CL 113* 114* 118*  CO2 19* 20* 21*  BUN 38* 37* 39*  CREATININE 0.79 0.77 0.78  GLUCOSE 167* 169* 189*    Liver Enzymes Recent Labs  Lab 07/16/19 0200 07/17/19 0251  AST 26 34  ALT 16 26  ALKPHOS 207* 230*  BILITOT <0.1* 0.4  ALBUMIN 1.3* 1.2*    Electrolytes Recent Labs  Lab 07/16/19 0200 07/17/19 0251 07/18/19 0222  CALCIUM 8.2* 8.4* 8.4*  MG  --   --  1.9    CBC Recent Labs  Lab 07/17/19 0251 07/18/19 0222 07/20/19 0752  WBC 13.1* 12.9* 15.4*  HGB 8.2* 7.0* 6.5*  HCT 27.1* 22.8* 21.6*  PLT 394 366 399    ABG No results for input(s): PHART, PCO2ART, PO2ART in the last 168 hours.  Coag's No results for  input(s): APTT, INR in the last 168 hours.  Sepsis Markers No results for input(s): LATICACIDVEN, PROCALCITON, O2SATVEN in the last 168 hours.  CRITICAL CARE Performed by: Delfin Gant   Total critical care time: 35 minutes  Critical care time was exclusive of separately billable procedures and treating other patients.  Critical care was necessary to treat or prevent imminent or life-threatening deterioration.  Critical care was time spent personally by me on the following activities: Acute respitaroy failure, cardiac arrest, development of treatment plan with patient and/or surrogate as well as nursing, discussions with consultants, evaluation of patient's response to treatment, examination of patient, obtaining history from patient or surrogate, ordering and performing treatments and interventions, ordering and review of laboratory studies, ordering and review of radiographic  studies, pulse oximetry and re-evaluation of patient's condition.  Delfin Gant, NP-C [facility] Contact / Pager information can be found on Amion  07/22/2019, 9:23 AM

## 2019-07-23 DIAGNOSIS — J9600 Acute respiratory failure, unspecified whether with hypoxia or hypercapnia: Secondary | ICD-10-CM

## 2019-07-23 LAB — GLUCOSE, CAPILLARY
Glucose-Capillary: 159 mg/dL — ABNORMAL HIGH (ref 70–99)
Glucose-Capillary: 193 mg/dL — ABNORMAL HIGH (ref 70–99)
Glucose-Capillary: 213 mg/dL — ABNORMAL HIGH (ref 70–99)
Glucose-Capillary: 163 mg/dL — ABNORMAL HIGH (ref 70–99)

## 2019-07-23 MED ORDER — POLYVINYL ALCOHOL 1.4 % OP SOLN
1.0000 [drp] | Freq: Four times a day (QID) | OPHTHALMIC | Status: DC | PRN
  Filled 2019-07-23: qty 15

## 2019-07-23 MED ORDER — MORPHINE SULFATE (PF) 2 MG/ML IV SOLN: 2.0000 mg | INTRAVENOUS | Status: DC | PRN

## 2019-07-23 MED ORDER — MORPHINE 100MG IN NS 100ML (1MG/ML) PREMIX INFUSION
0.0000 mg/h | INTRAVENOUS | Status: DC
  Administered 2019-07-23: 5 mg/h via INTRAVENOUS
  Administered 2019-07-24: 15 mg/h via INTRAVENOUS
  Filled 2019-07-23 (×2): qty 100

## 2019-07-23 MED ORDER — GLYCOPYRROLATE 0.2 MG/ML IJ SOLN
0.2000 mg | INTRAMUSCULAR | Status: DC | PRN
  Administered 2019-07-23: 0.2 mg via INTRAVENOUS
  Filled 2019-07-23: qty 1

## 2019-07-23 MED ORDER — SODIUM CHLORIDE 0.45 % IV SOLN: INTRAVENOUS | Status: DC

## 2019-07-23 MED ORDER — GLYCOPYRROLATE 0.2 MG/ML IJ SOLN: 0.2000 mg | INTRAMUSCULAR | Status: DC | PRN

## 2019-07-23 MED ORDER — MIDAZOLAM HCL 2 MG/2ML IJ SOLN
2.0000 mg | INTRAMUSCULAR | Status: DC | PRN
  Administered 2019-07-23 – 2019-07-24 (×2): 2 mg via INTRAVENOUS
  Filled 2019-07-23 (×2): qty 2

## 2019-07-23 MED ORDER — DEXTROSE 5 % IV SOLN: INTRAVENOUS | Status: DC

## 2019-07-23 MED ORDER — DIPHENHYDRAMINE HCL 50 MG/ML IJ SOLN: 25.0000 mg | INTRAMUSCULAR | Status: DC | PRN

## 2019-07-23 MED ORDER — ACETAMINOPHEN 650 MG RE SUPP: 650.0000 mg | Freq: Four times a day (QID) | RECTAL | Status: DC | PRN

## 2019-07-23 MED ORDER — MORPHINE BOLUS VIA INFUSION
5.0000 mg | INTRAVENOUS | Status: DC | PRN
  Filled 2019-07-23: qty 5

## 2019-07-23 MED ORDER — ACETAMINOPHEN 325 MG PO TABS: 650.0000 mg | ORAL_TABLET | Freq: Four times a day (QID) | ORAL | Status: DC | PRN

## 2019-07-23 MED ORDER — GLYCOPYRROLATE 1 MG PO TABS: 1.0000 mg | ORAL_TABLET | ORAL | Status: DC | PRN

## 2019-07-23 NOTE — Progress Notes (Signed)
eLink Physician-Brief Progress Note Patient Name: Karina Sloan DOB: Dec 15, 1940 MRN: [id number]   Date of Service  07/23/2019  HPI/Events of Note  Per primary team, family desires to move to comfort care tonight. Spoke with patient's daughter, [name], who confirms that given her mother's poor prognosis, the family indeed does desire to move to comfort care and allow the patient to pass with comfort and dignity.   eICU Interventions  Will make the patient comfort measures and proceed with with the withdrawal of life sustaining support protocol.      Intervention Category Major Interventions: End of life / care limitation discussion  Lenell Antu 07/23/2019, 8:06 PM

## 2019-07-23 NOTE — Progress Notes (Signed)
Nutrition Brief Note  Chart reviewed. Per CCM family desires transition to full comfort care this PM.  No further nutrition interventions planned at this time.  Please re-consult as needed.   Cammy Copa., RD, LDN, CNSC See AMiON for contact information

## 2019-07-23 NOTE — Progress Notes (Signed)
PULMONARY / CRITICAL CARE MEDICINE   NAME:  Karina Sloan, MRN:  [id number], DOB:  05/23/1941, LOS: 21 ADMISSION DATE:  Jul 14, 2019, CONSULTATION DATE: 14-Jul-2019 REFERRING MD: Emergency department physician, CHIEF COMPLAINT: Status post bradycardic event  BRIEF HISTORY:    79 year old female found pulseless at SNF and had short CPR by EMS. Unresponsive in ED and 36C TTM started.   HISTORY OF PRESENT ILLNESS   79 year old female with a plethora of health issues he was transferred from [facility] to a skilled nursing facility in [facility].  She is a resident of [place].  COVID-19 diagnosed 06/15/19.  She also suffers from diabetes with vascular insufficiency of the lower extremities requiring surgical interventions.  Reported to have dementia.  Reported to have hypertension.  She also suffers from anemia, delirium, bilateral carotid stenosis, acute and chronic lower GI bleeding, congestive heart failure, acute osteomyelitis of left foot.  She has AV malformation. She was found to in the skilled nursing facility on med pass by the nurse to be unresponsive.  She noted no radial pulse. EMS was activated and noted no pulse, started CPR on their arrival. She is placed on epi drip transcutaneous pacing airway protection and transported to [facility] emergency department, reportedly she has been intubated and she is now off epinephrine and transcutaneous pacer at this time.  Reported to be a full code at this time.  Pulmonary critical care asked to admit.  SIGNIFICANT PAST MEDICAL HISTORY   Mobitz type II heart block, Diabetes, Dementia, Vascular insufficiency secondary to diabetes History of COVID-19 06/15/2019  SIGNIFICANT EVENTS:  07/14/2019 admitted to [place]:   CT head 2/18 > nonacute EEG 2/18 >Severe encephalopathy. No Sz seen.  TTE 2/18: Left ventricular ejection fraction, by estimation, is 60 to 65%. The  left ventricle has normal  function.Right ventricular systolic function is normal.   CULTURES:  COVID 1/22 and 2/18 positive Blood 2/18 > negative Urine 2/18 > No predominant species Sputum 2/18 > negative  ANTIBIOTICS:  Cefepime 2/18 > 2/24 Vanco 2/18 > 2/24  LINES/TUBES:  endotracheal tube 2/18 >  CONSULTANTS:  WOC  SUBJECTIVE:  No events overnight   CONSTITUTIONAL: BP (!) 154/49   Pulse (!) 49   Temp 99.8 F (37.7 C) (Axillary)   Resp 10   Ht 5\' 8"  (1.727 m)   Wt 70.4 kg   SpO2 100%   BMI 23.60 kg/m   I/O last 3 completed shifts: In: 3412.9 [I.V.:1792.9; NG/GT:1620] Out: 325 [Urine:325]     Vent Mode: PSV;CPAP FiO2 (%):  [40 %] 40 % PEEP:  [5 cmH20] 5 cmH20 Pressure Support:  [8 cmH20] 8 cmH20  PHYSICAL EXAM: General:  Chronically ill and frail elderly female in NAD on MV HEENT: MM pink/moist with parts scabbed/ dried, ETT, left nare cortrak, pupils 3/reactive Neuro: ?attempts to open eyes otherwise unresponsive CV: rr ir- 2nd block type 1 on monitor, no murmur PULM:  Remains on PSV 8/5, coarse which clears with suctioning, bright yellow thick secretions GI: round, soft, bs active  Extremities: warm/dry, dependent edema UE> LE, severe muscle wasting, bilateral foot wrapped in kerlix dressings  Skin: no rashes    RESOLVED PROBLEM LIST  Septic shock AKI  ASSESSMENT AND PLAN    Critically ill due to acute hypoxic respiratory failure due to encephalopathy from cardiac arrest and possible pneumonia Cardiac arrest, unclear etiology  Acute metabolic encephalopathy due to anoxic injury over preexisting dementia. Anemia  Hypernatremia  Multiple wounds  Protein calorie malnutrition - last sedation 2/23  P:  Patient's encephalopathy continues to be a barrier to extubation.  Continue full MV support/ SBT trials but no extubation.  I have spoken with patient's son, Hillard Danker and daughter, Patsy Lager this morning.  Plan of care is to transition to full comfort care and one way extubation  with family present for tomorrow 3/2 around 1400.   We will continue current care without any escalation of care including labs draws, blood transfusion, or antibiotics as this will not change her outcome.  She remains a DNR/ DNI  Daily Goals Checklist  Pain/Anxiety/Delirium protocol (if indicated): n/a VAP protocol (if indicated): bundle in place Respiratory support goals: SBT, hold extubation given mental status  DVT prophylaxis: UFH tid Nutrition Status: severe malnutrition/ TF GI prophylaxis: PPI Code Status: DNR Family Communication:  Spoke with son, Hillard Danker [phone] and Patsy Lager over telephone conference.   Disposition: ICU   LABS  Glucose Recent Labs  Lab 07/22/19 1124 07/22/19 1525 07/22/19 1942 07/22/19 2324 07/23/19 0330 07/23/19 0748  GLUCAP 198* 192* 210* 208* 159* 193*    BMET Recent Labs  Lab 07/17/19 0251 07/18/19 0222 07/22/19 1442  NA 144 148* 157*  K 4.0 3.6 4.0  CL 114* 118* 124*  CO2 20* 21* 25  BUN 37* 39* 44*  CREATININE 0.77 0.78 0.74  GLUCOSE 169* 189* 216*    Liver Enzymes Recent Labs  Lab 07/17/19 0251 07/22/19 1442  AST 34 18  ALT 26 29  ALKPHOS 230* 234*  BILITOT 0.4 0.6  ALBUMIN 1.2* 1.2*    Electrolytes Recent Labs  Lab 07/17/19 0251 07/18/19 0222 07/22/19 1442  CALCIUM 8.4* 8.4* 8.6*  MG  --  1.9  --     CBC Recent Labs  Lab 07/18/19 0222 07/20/19 0752 07/22/19 1442  WBC 12.9* 15.4* 15.7*  HGB 7.0* 6.5* 6.4*  HCT 22.8* 21.6* 21.7*  PLT 366 399 413*    ABG No results for input(s): PHART, PCO2ART, PO2ART in the last 168 hours.  Coag's No results for input(s): APTT, INR in the last 168 hours.  Sepsis Markers No results for input(s): LATICACIDVEN, PROCALCITON, O2SATVEN in the last 168 hours.  CRITICAL CARE Performed by: Posey Boyer   Total critical care time: 35 minutes   Critical care time was exclusive of separately billable procedures and treating other patients.   Critical care was  necessary to treat or prevent imminent or life-threatening deterioration.   Critical care was time spent personally by me on the following activities: development of treatment plan with patient and/or surrogate as well as nursing, discussions with consultants, evaluation of patient's response to treatment, examination of patient, obtaining history from patient or surrogate, ordering and performing treatments and interventions, ordering and review of laboratory studies, ordering and review of radiographic studies, pulse oximetry and re-evaluation of patient's condition.  Posey Boyer, MSN, AGACNP-BC  Pulmonary & Critical Care 07/23/2019, 8:59 AM

## 2019-07-23 NOTE — Procedures (Signed)
Extubation Procedure Note  Patient Details:   Name: Karina Sloan DOB: 25-Apr-1941 MRN: [id number]   Airway Documentation:    Vent end date: 07/23/19 Vent end time: 2038   Evaluation  O2 sats: currently acceptable Complications: No apparent complications Patient did tolerate procedure well. Bilateral Breath Sounds: Clear, Diminished   No   Patient was terminally extubated to 2 L [place].. RT and RN were at bedside during procedure.  MADINAH QUARRY 07/23/2019, 8:40 PM

## 2019-07-23 NOTE — Progress Notes (Signed)
Karina Sloan called back to request that we transition to full comfort care tonight.  Plans for around 7-8 pm.

## 2019-07-23 DEATH — deceased

## 2019-08-21 DIAGNOSIS — I441 Atrioventricular block, second degree: Secondary | ICD-10-CM | POA: Diagnosis present

## 2019-08-21 DIAGNOSIS — N179 Acute kidney failure, unspecified: Secondary | ICD-10-CM | POA: Diagnosis present

## 2019-08-21 DIAGNOSIS — G931 Anoxic brain damage, not elsewhere classified: Secondary | ICD-10-CM | POA: Diagnosis present

## 2019-08-21 DIAGNOSIS — E119 Type 2 diabetes mellitus without complications: Secondary | ICD-10-CM

## 2019-08-21 DIAGNOSIS — I739 Peripheral vascular disease, unspecified: Secondary | ICD-10-CM | POA: Diagnosis present

## 2019-08-21 DIAGNOSIS — U071 COVID-19: Secondary | ICD-10-CM | POA: Diagnosis present

## 2019-08-23 NOTE — Death Summary Note (Signed)
Time of death noted to be 0640 by Clinical research associate and additional RN. PIVs dc'd, 54mL Morphine wasted with Youlanda Roys, RN. Writer contacted [employer] and pt's son/daughter (do not wish to see pt prior to transport to morgue). Emotional support offered to family, all questions addressed. Body prepared and placed in bag. [facility] previously contacted, already made aware 3/1 prior to pt death, pt still does not qualify for eye/tissue donation. Day shift RN to contact patient placement and transport body to morgue.

## 2019-08-23 NOTE — Death Summary Note (Signed)
  DEATH SUMMARY   Patient Details  Name: Karina Sloan MRN: [id number] DOB: 04/21/1941  Admission/Discharge Information   Admit Date:  2019-07-13  Date of Death: Date of Death: 07/25/19  Time of Death: Time of Death: 0640  Length of Stay: 2022/09/04  Referring Physician: Karna Dupes, MD   Reason(s) for Hospitalization  Cardiac arrest  Diagnoses  Preliminary cause of death:  Secondary Diagnoses (including complications and co-morbidities):  Principal Problem:   Septic shock (HCC) Active Problems:   Cardiac arrest (HCC)   Pressure injury of skin   Acute metabolic encephalopathy   Hypoglycemia   Acute on chronic respiratory failure (HCC)   Hypoxic encephalopathy (HCC)   COVID-19   Acute renal failure (ARF) (HCC)   2nd degree AV block   Diabetes mellitus (HCC)   PVD (peripheral vascular disease) [facility])   Brief Hospital Course (including significant findings, care, treatment, and services provided and events leading to death)  [name] was a 79 y.o. year old female with history of diabetes, hypertension, peripheral vascular disease, delirium, carotid artery disease, anemia due to acute and chronic lower GI bleeding, CHF, chronic pressure wounds of the lower extremities with osteomyelitis.  She had COVID-19 06/15/2019.  She was found unresponsive in her skilled nursing facility, discovered to be pulseless and underwent CPR.  Epinephrine drip initiated, transient transcutaneous pacing and the patient was intubated.  She was treated empirically with antibiotics for presumed septic shock, culture data negative.  Her repeat COVID-19 testing remained positive.  She was able to come off transcutaneous pacing.  She weaned off of pressors over the next several days.  She was able to tolerate some spontaneous breathing but unfortunately remained profoundly encephalopathic, presumed to be a hypoxic brain injury.  She was not a candidate for extubation for this reason.  Discussions were  undertaken with the patient's family and decision was made to transition her to comfort based approach.  This was done on 24-Jul-2019. She died on 07-25-19.    Levy Pupa, MD, PhD 08/21/2019, 6:34 PM [facility] [phone] or if no answer [phone]

## 2019-08-23 NOTE — Progress Notes (Signed)
Pt's daughter, granddaughter, son, and daughter-in-law at bedside. Pt's daughter Patsy Lager) spoke with Dr. Arsenio Loader, any and all questions addressed, comfort orders placed for pt. Previous TF, meds, and infusions dc'd. D5 and morphine gtt initiated. RT at bedside, family escorted to hall, coretrak removed and pt extubated @ 2038. [place] O2 placed for comfort. Pt's family returned to bedside, spiritual needs addressed, family reports no needs at this time. PRNs administered to promote pt comfort. Pt's family left bedside @ 2215, will contact pt's son and daughter with updates.

## 2019-08-23 DEATH — deceased

## 2021-05-20 IMAGING — DX DG CHEST 1V PORT
1 series · 1 of 1 positions shown · non-contrast
Comparison: None.

CLINICAL DATA: Status post cardiac arrest today. ETT and NG tube
placement.

EXAM:
PORTABLE CHEST 1 VIEW

[chest]
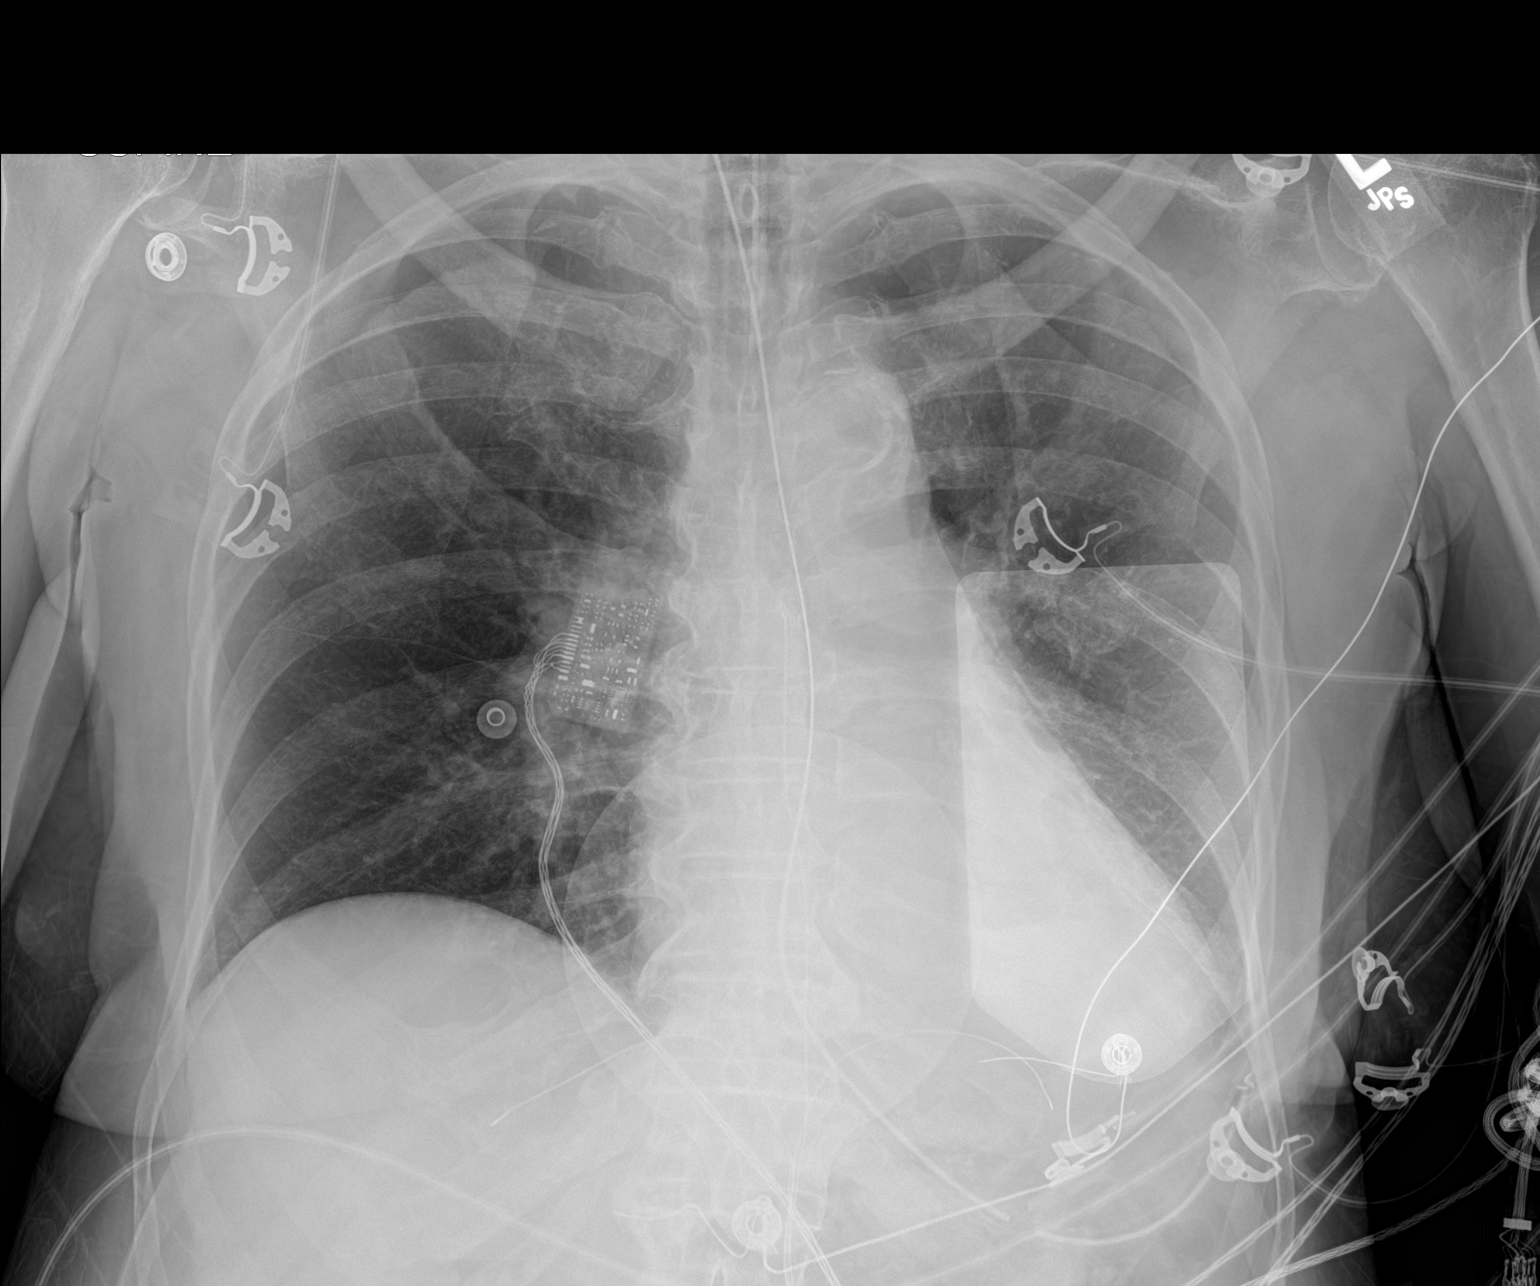

[1 of 1 positions shown; findings below may reference images not displayed]

FINDINGS: NG tube is in place with the tip in good position in the fundus of
the stomach. ET tube is also in good position with its tip at the
level of the clavicular heads. Lungs are clear. Heart size is
normal. Atherosclerosis. No pneumothorax or pleural effusion.
IMPRESSION: ETT and NG tube in good position.

Lungs clear.

Atherosclerosis.

## 2021-05-21 IMAGING — DX DG CHEST 1V PORT
1 series · 1 of 1 positions shown · non-contrast
Comparison: July 12, 2019

CLINICAL DATA: Cardiac arrest.

EXAM:
PORTABLE CHEST 1 VIEW

[chest ap]
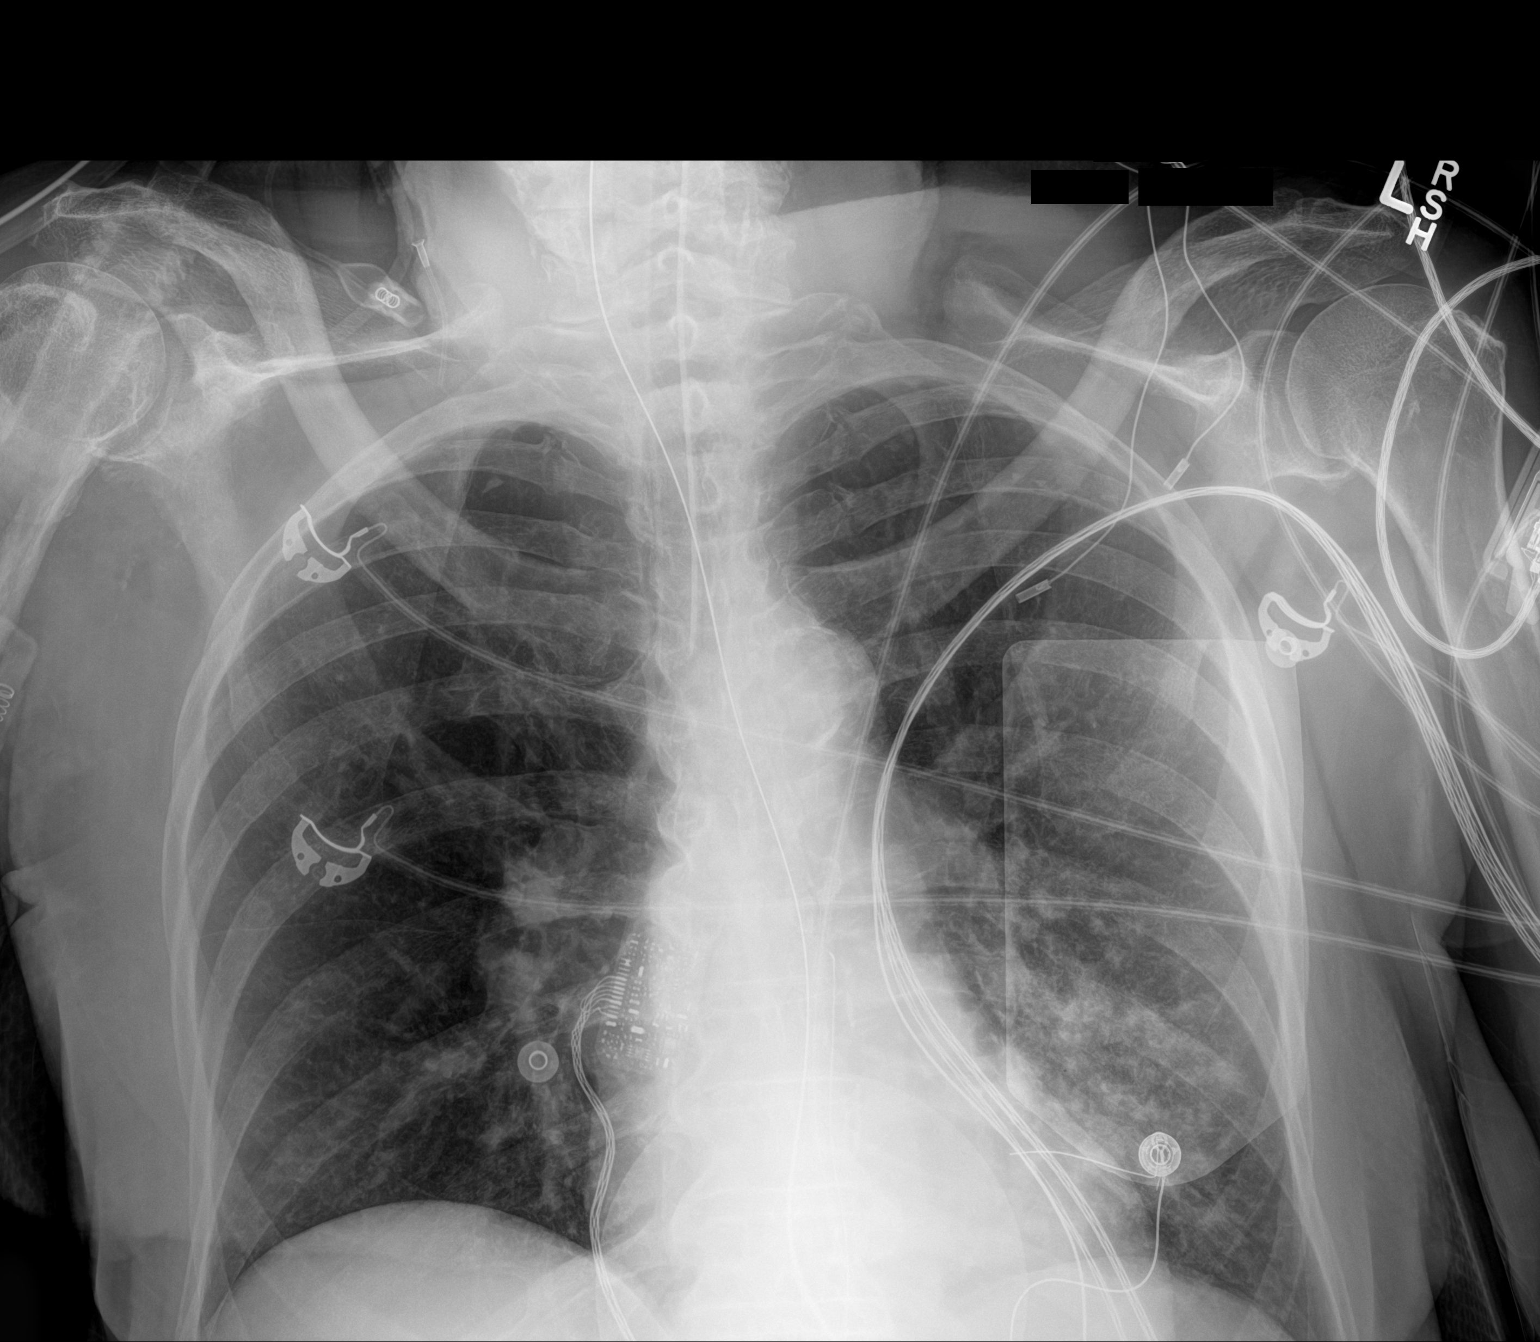

[1 of 1 positions shown; findings below may reference images not displayed]

FINDINGS: An ETT is in good position. The NG tube terminates below today's
film. Left infrahilar opacity is new in the interval. No
pneumothorax. The heart, hila, and mediastinum are unchanged.
IMPRESSION: 1. Support apparatus as above.
2. New left infrahilar opacities suggestive of pneumonia or
aspiration. Recommend short-term follow-up to ensure resolution.

## 2021-05-22 IMAGING — DX DG CHEST 1V PORT
1 series · 1 of 1 positions shown · non-contrast
Comparison: Yesterday

CLINICAL DATA: Respiratory failure

EXAM:
PORTABLE CHEST 1 VIEW

[chest ap]
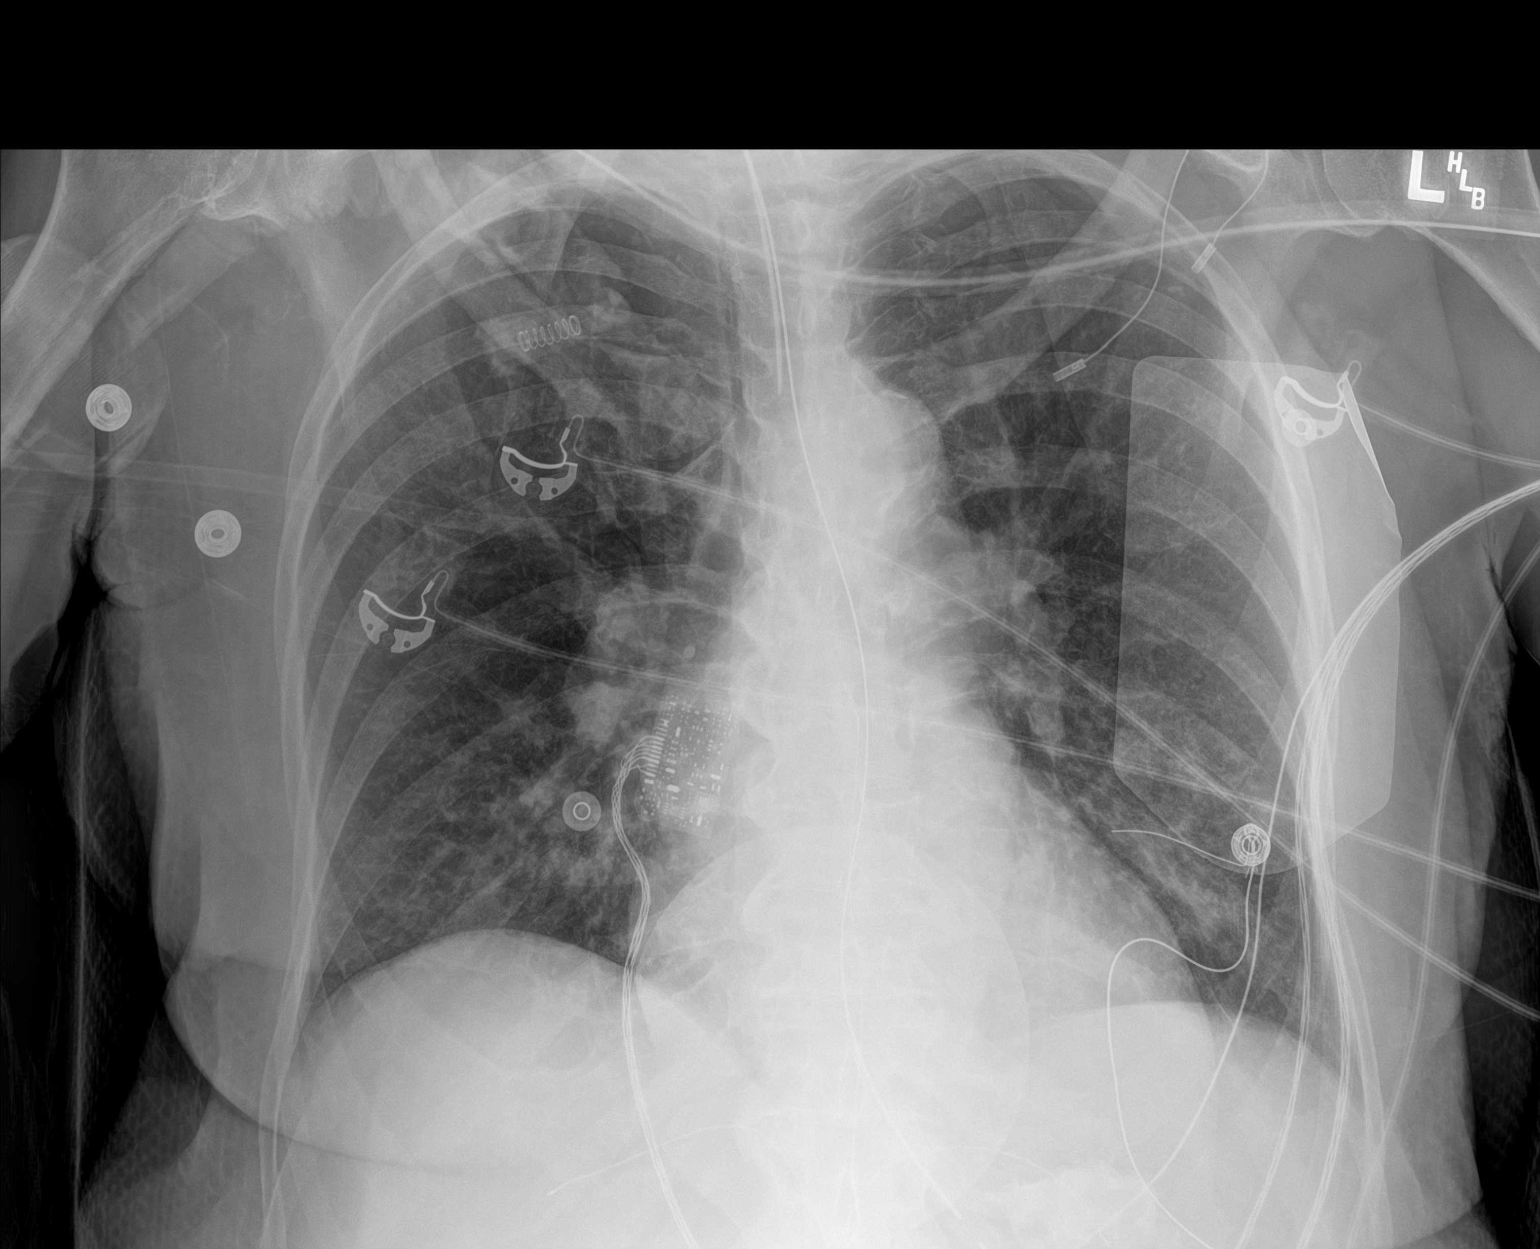

[1 of 1 positions shown; findings below may reference images not displayed]

FINDINGS: Endotracheal tube tip is at the clavicular heads. The orogastric
tube reaches the stomach with terminating radiopaque marker
presumably representing the side port that is partially covered.
Improved aeration at the left base. No edema, effusion, or
pneumothorax. Normal heart size.
IMPRESSION: Improved aeration.  Unremarkable endotracheal tube.

## 2021-05-30 IMAGING — DX DG CHEST 1V PORT
1 series · 1 of 1 positions shown · non-contrast
Comparison: 07/14/2019

CLINICAL DATA: Respiratory failure

EXAM:
PORTABLE CHEST 1 VIEW

[chest]
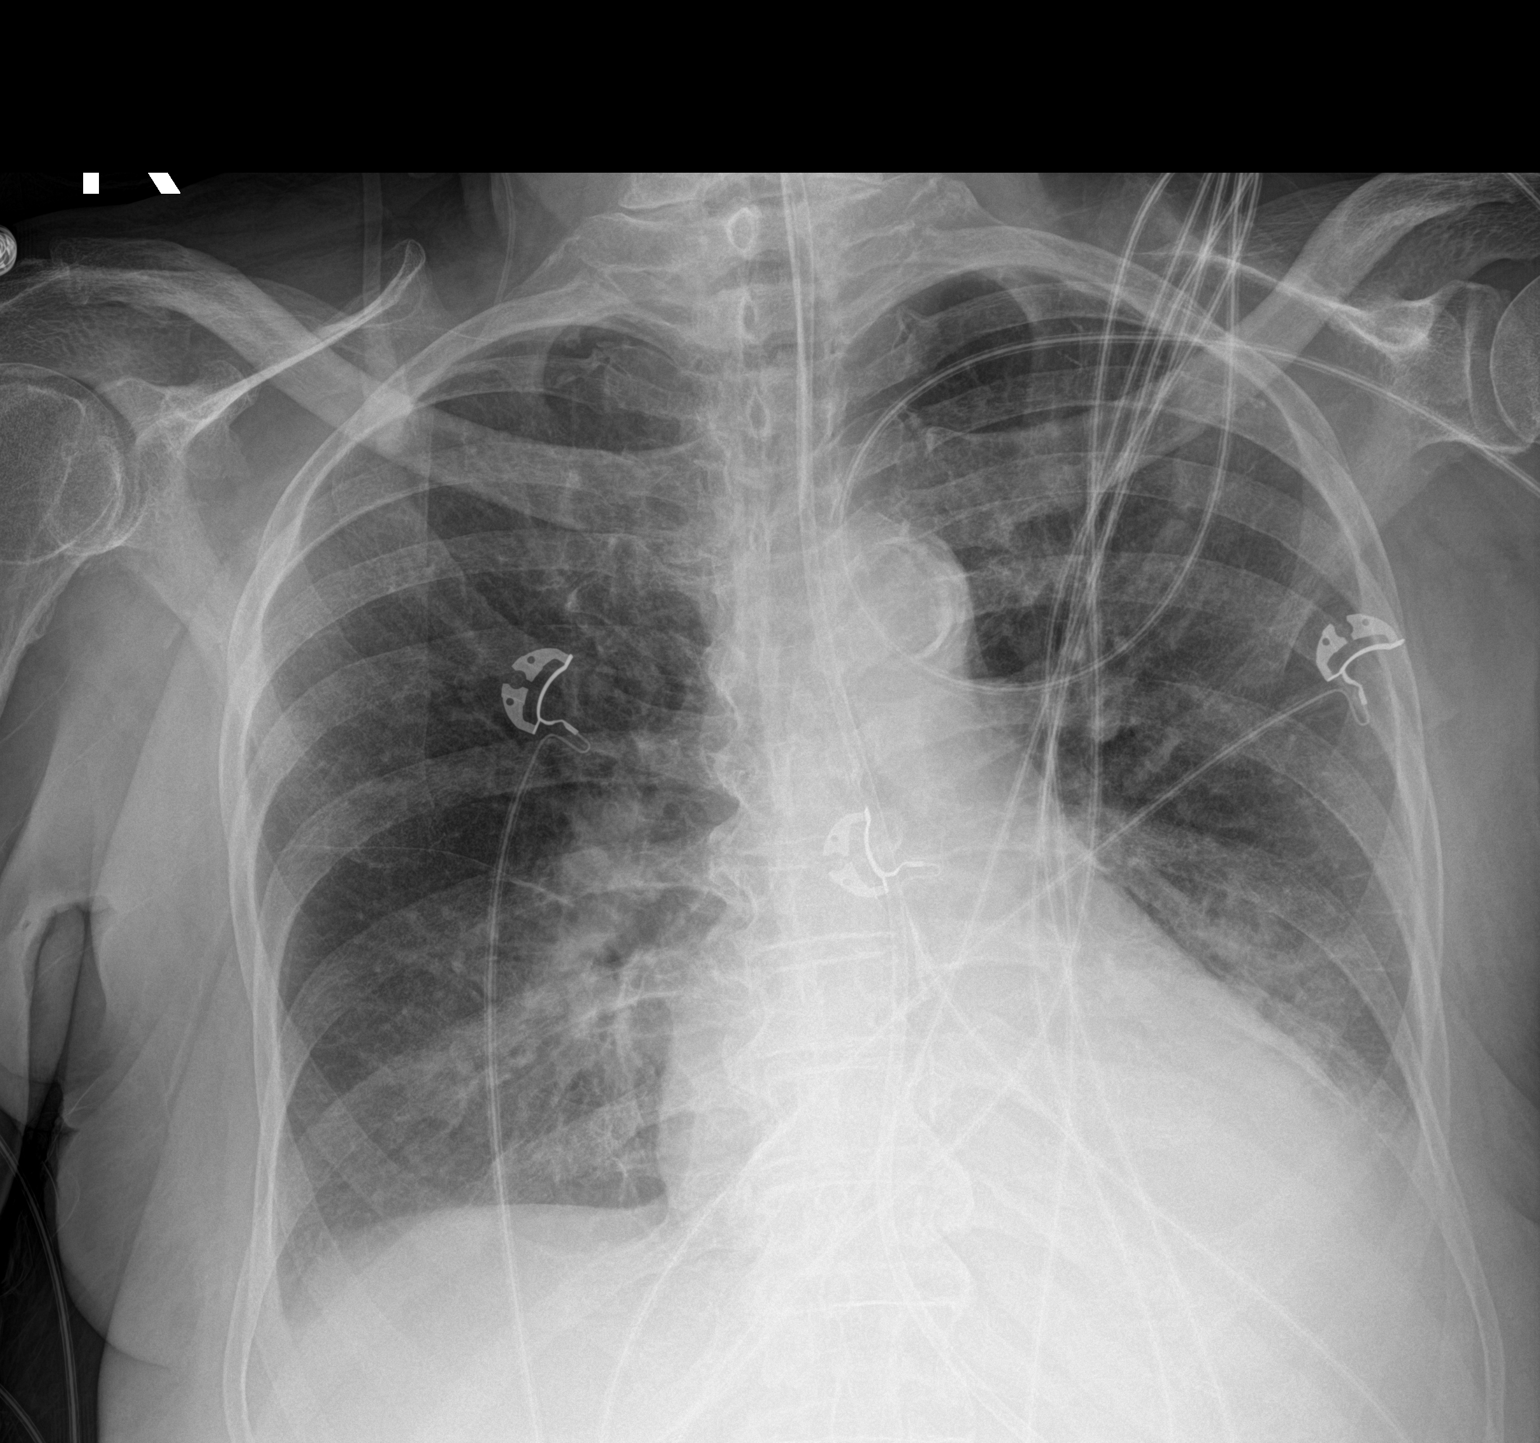

[1 of 1 positions shown; findings below may reference images not displayed]

FINDINGS: An endotracheal tube with tip and small bore feeding tube entering
the stomach with tip off the field of view noted.

Increasing bilateral LOWER lung airspace opacities noted.

There may be trace bilateral pleural effusions present.

There is no evidence of pneumothorax.

No acute bony abnormalities are present.
IMPRESSION: Increasing bilateral LOWER lung airspace opacities which may
represent increasing edema/pneumonia.
